# Patient Record
Sex: Female | Born: 1985 | Race: White | Hispanic: No
Health system: Western US, Academic
[De-identification: ages and names within clinical notes are randomized; demographics above are authoritative.]

## PROBLEM LIST (undated history)

## (undated) DIAGNOSIS — F329 Major depressive disorder, single episode, unspecified: Secondary | ICD-10-CM

## (undated) DIAGNOSIS — Z9884 Bariatric surgery status: Secondary | ICD-10-CM

## (undated) DIAGNOSIS — R569 Unspecified convulsions: Secondary | ICD-10-CM

## (undated) DIAGNOSIS — G40909 Epilepsy, unspecified, not intractable, without status epilepticus: Secondary | ICD-10-CM

## (undated) DIAGNOSIS — F32A Depression, unspecified: Secondary | ICD-10-CM

## (undated) DIAGNOSIS — S069XAS Unspecified intracranial injury with loss of consciousness status unknown, sequela: Secondary | ICD-10-CM

## (undated) DIAGNOSIS — F419 Anxiety disorder, unspecified: Secondary | ICD-10-CM

## (undated) HISTORY — PX: GASTRIC BYPASS: SHX52

## (undated) HISTORY — DX: Depression, unspecified: F32.A

## (undated) HISTORY — DX: Anxiety disorder, unspecified: F41.9

## (undated) HISTORY — PX: WISDOM TOOTH EXTRACTION: SHX5011

## (undated) HISTORY — DX: Unspecified intracranial injury with loss of consciousness status unknown, sequela: S06.9XAS

## (undated) HISTORY — DX: Epilepsy, unspecified, not intractable, without status epilepticus: G40.909

## (undated) DEATH — deceased

---
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
## 2006-07-18 ENCOUNTER — Other Ambulatory Visit: Admission: RE | Admit: 2006-07-18 | Discharge: 2006-07-18 | Payer: Self-pay | Admitting: Obstetrics and Gynecology

## 2010-03-19 ENCOUNTER — Emergency Department (HOSPITAL_COMMUNITY): Admission: EM | Admit: 2010-03-19 | Discharge: 2009-09-21 | Payer: Self-pay | Admitting: Infectious Diseases

## 2018-03-01 ENCOUNTER — Encounter (INDEPENDENT_AMBULATORY_CARE_PROVIDER_SITE_OTHER): Payer: Self-pay | Admitting: Family Medicine

## 2018-03-01 ENCOUNTER — Ambulatory Visit (INDEPENDENT_AMBULATORY_CARE_PROVIDER_SITE_OTHER): Payer: Self-pay | Admitting: Family Medicine

## 2018-03-01 VITALS — BP 106/77 | HR 82 | Temp 97.5°F | Resp 16 | Ht 65.0 in | Wt 181.9 lb

## 2018-03-01 DIAGNOSIS — F419 Anxiety disorder, unspecified: Secondary | ICD-10-CM

## 2018-03-01 DIAGNOSIS — G40409 Other generalized epilepsy and epileptic syndromes, not intractable, without status epilepticus: Secondary | ICD-10-CM

## 2018-03-01 DIAGNOSIS — F331 Major depressive disorder, recurrent, moderate: Secondary | ICD-10-CM

## 2018-03-01 DIAGNOSIS — F339 Major depressive disorder, recurrent, unspecified: Secondary | ICD-10-CM | POA: Insufficient documentation

## 2018-03-01 DIAGNOSIS — Z6830 Body mass index (BMI) 30.0-30.9, adult: Secondary | ICD-10-CM

## 2018-03-01 DIAGNOSIS — F1020 Alcohol dependence, uncomplicated: Secondary | ICD-10-CM

## 2018-03-01 NOTE — Progress Notes (Signed)
CC:    Chief Complaint   Patient presents with   . Follow-Up      Discuss about her medications        HPI  Marilyn Webster is a 32 year old female who presents to the office today to establish care.  She has moved temporally with her parents from [place] to have a time to recover.  She is an alcoholic and she has stopped drinking for 14 days.  Did have some rough times but she had no seizures and no complications.  She is doing well and she is committed to stay sober.  Has been taking medications regularly for depression anxiety and ADD and feels well.  She has generalized tonic clonic seizures and is under the care of a neurologist. Taking Zonisamide.  During a recent period of heavy drinking she stopped taking all her medications and has had more seizures.  She is taking Wellbutrin and MOP is concerns that this is increasing the risk of seizures.  Patient claims to be on this medication for years and has no intention to stop.  She has been taking Amphetamines since HS. Not clear to me what evaluation was done and what was the indication.  Patient is willing to wean off it.    She takes Alprazolam for anxiety. Also willing to wean off.  She has migraines as well and is not taking triptans because this did not work for her.        Over the last 2 weeks how often have you been bothered by any of the following problems?  1. Little interest or pleasure in doing things: (!) Nearly every day  2. Feeling down, depressed or hopeless: (!) Nearly every day  3. Trouble falling or staying asleep, or sleeping too much: Several days  4. Feeling tired or having little energy : (!) More than half the days  5. Poor appetite or overeating: (!) More than half the days  6. Feeling bad about yourself - or that you are a failure or have let yourself or your family down: (!) Nearly every day  7. Trouble concentrating on things, such as reading the newspaper or watching television: (!) More than half the days  8. Moving or speaking much more  slowly than usual.  Or the opposite - fidgety or restless: Several days  9. Thoughts that you would be better off dead or of hurting yourself in some way: Not at all    Total  PHQ9 Total Score: 17    10. If you checked off any problems, how difficult have these problems made it for you to do your work, take care of things at home, or get along with other people?: Very difficult      GAD7 Score, Total: 18  GAD7 Score, Difficulty: If you checked any problems, how difficult have they made it for you to do your work, take care of things at home, or get along with other people?: Very difficult    Her contact info for her current caregivers in [place]:    Dr Dwain S[facility] [phone]  Dr April Fields -[facility] [phone]    SH: patient has not been able to keep her job  She is separated form her husband and financially dependent.      Past Medical History  Past Medical History:   Diagnosis Date   . Anxiety    . Depression    . Epilepsy posttraumatic (HCC)  Past Surgical History  Past Surgical History:   Procedure Laterality Date   . LAPAROSCOP GASTRIC BYPASS     . WISDOM TOOTH EXTRACTION         Allergies  Review of patient's allergies indicates:  No Known Allergies    Medications  Current Outpatient Medications   Medication Sig Dispense Refill   . ALPRAZolam 1 MG tablet Take by mouth.     Marland Kitchen amphetamine-dextroAMPHetamine 20 MG tablet Take by mouth.     Marland Kitchen aspirin-acetaminophen-caffeine (EXCEDRIN MIGRAINE) 250-250-65 MG tablet Take by mouth.     Marland Kitchen buPROPion 100 MG tablet Take by mouth.     . Butalbital-APAP-Caffeine (FIORICET) 50-300-40 MG capsule Take by mouth.     . propranolol 20 MG tablet Take by mouth.     . QUEtiapine (SEROQUEL) 300 MG tablet Take by mouth.     . sertraline 100 MG tablet Take by mouth.     . zonisamide 100 MG capsule Take by mouth.       No current facility-administered medications for this visit.        Family History  Family History     Problem (# of Occurrences) Relation  (Name,Age of Onset)    Anxiety Disorder (1) Father    Depression (2) Mother, Father    Hypertension (1) Mother    Sleep Apnea (2) Mother, Father           Social History    Social History     Tobacco Use   . Smoking status: Never Smoker   . Smokeless tobacco: Never Used   Substance Use Topics   . Alcohol use: Not Currently   . Drug use: Not Currently         ROS   Per HPI    Physical Exam  BP 106/77   Pulse 82   Temp 97.5 F (36.4 C) (Temporal)   Resp 16   Ht 5\' 5"  (1.651 m)   Wt 181 lb 14.1 oz (82.5 kg)   LMP 02/26/2018 (Exact Date)   SpO2 100%   Breastfeeding? No   BMI 30.27 kg/m  Body mass index is 30.27 kg/m.  General appearance: healthy, alert, no distress, smiling, oriented x 3  HEENT:normocephalic, PERRLA, EOMI, normal nasal mucosa,   EXT:no clubbing, cyanosis, or edema  Neuro: grossly normal  PSYCHIATRIC   *Judgement: normal   *Orientation: oriented to person, place, time   *Memory: grossly normal   *Mood/affect: full range of affect   *Thought process: normal   *Grooming: normal   *Eye Contact: normal   *Speech: normal   *Hygiene: normal      Assessment/Plan    1. Tonic clonic seizures (HCC)  Continue medications.  At this point if wellbutrin has been used for years, I don't think it is urgent to stop it at the moment.    2. Alcoholism [facility])  Patient will connect with counseling through her mom's work and follow up with me with any needs.  Advised to take a Vit B complex supplement as well as calcium and magnesium.    3. Moderate episode of recurrent major depressive disorder (HCC)  Continue medications. Limited resources for referral.  Consider BHIP if there is enough time as patient is returning to [facility] in December    4. Anxiety  Advised to wean off benzos.  Continue the other medications.  Introduce exercise and counseling.  Follow up with me as needed.     I spent a total time of 30  minutes face-to-face with the patient, of which more than 50% was spent counseling as outlined in this note  including recommendation for medication and coordination of care.      Diagnosis and treatment options have been explained to the patient including medications side effects.  Questions have been addressed.  Patient verbalized understanding and agrees with the plan outlined today. Patient will return to medical attention if symptoms persist or worsen. Patient advised to read the information that will be provided by his pharmacist and to call if questions.  AVS was reviewed with patient.     Elsie SaasEsther Sharonne Ricketts, MD  [facility]  [facility]    Notes on Charting and calculation of UJW:[id number]:details of coordination of care may appear as preceding or following telephone encounters, or notes in order details sections of orders, or in notes attached to labs ordered incident to this visit.

## 2018-03-16 ENCOUNTER — Encounter (INDEPENDENT_AMBULATORY_CARE_PROVIDER_SITE_OTHER): Payer: Self-pay | Admitting: Family Medicine

## 2018-03-16 DIAGNOSIS — F419 Anxiety disorder, unspecified: Secondary | ICD-10-CM

## 2018-03-16 MED ORDER — BUPROPION HCL ER (SR) 100 MG OR TB12
100.0000 mg | EXTENDED_RELEASE_TABLET | Freq: Two times a day (BID) | ORAL | 0 refills | Status: AC
Start: 2018-03-16 — End: ?

## 2018-03-16 NOTE — Telephone Encounter (Signed)
Routing to provider.     Pharmacy pended.    Please review and advise. Thank you.     From OV 03/01/2018:    1. Tonic clonic seizures (HCC)  Continue medications.  At this point if wellbutrin has been used for years, I don't think it is urgent to stop it at the moment.

## 2018-12-21 ENCOUNTER — Encounter (HOSPITAL_COMMUNITY): Payer: Self-pay | Admitting: Emergency Medicine

## 2018-12-21 ENCOUNTER — Other Ambulatory Visit: Payer: Self-pay

## 2018-12-21 ENCOUNTER — Observation Stay (HOSPITAL_COMMUNITY): Payer: Medicaid Other

## 2018-12-21 ENCOUNTER — Inpatient Hospital Stay (HOSPITAL_COMMUNITY)
Admission: EM | Admit: 2018-12-21 | Discharge: 2018-12-26 | DRG: 440 | Disposition: A | Discharge status: Home / Self Care | Payer: Medicaid Other | Attending: Internal Medicine | Admitting: Internal Medicine

## 2018-12-21 ENCOUNTER — Emergency Department (HOSPITAL_COMMUNITY): Payer: Medicaid Other

## 2018-12-21 DIAGNOSIS — Z20828 Contact with and (suspected) exposure to other viral communicable diseases: Secondary | ICD-10-CM | POA: Diagnosis present

## 2018-12-21 DIAGNOSIS — I10 Essential (primary) hypertension: Secondary | ICD-10-CM | POA: Diagnosis present

## 2018-12-21 DIAGNOSIS — K8020 Calculus of gallbladder without cholecystitis without obstruction: Secondary | ICD-10-CM

## 2018-12-21 DIAGNOSIS — F329 Major depressive disorder, single episode, unspecified: Secondary | ICD-10-CM | POA: Diagnosis present

## 2018-12-21 DIAGNOSIS — F419 Anxiety disorder, unspecified: Secondary | ICD-10-CM

## 2018-12-21 DIAGNOSIS — Z9109 Other allergy status, other than to drugs and biological substances: Secondary | ICD-10-CM

## 2018-12-21 DIAGNOSIS — Z888 Allergy status to other drugs, medicaments and biological substances status: Secondary | ICD-10-CM

## 2018-12-21 DIAGNOSIS — G40909 Epilepsy, unspecified, not intractable, without status epilepticus: Secondary | ICD-10-CM | POA: Diagnosis present

## 2018-12-21 DIAGNOSIS — K859 Acute pancreatitis without necrosis or infection, unspecified: Secondary | ICD-10-CM | POA: Diagnosis not present

## 2018-12-21 DIAGNOSIS — R05 Cough: Secondary | ICD-10-CM

## 2018-12-21 DIAGNOSIS — Z9884 Bariatric surgery status: Secondary | ICD-10-CM

## 2018-12-21 DIAGNOSIS — Z79899 Other long term (current) drug therapy: Secondary | ICD-10-CM

## 2018-12-21 DIAGNOSIS — R059 Cough, unspecified: Secondary | ICD-10-CM

## 2018-12-21 HISTORY — DX: Major depressive disorder, single episode, unspecified: F32.9

## 2018-12-21 HISTORY — DX: Unspecified convulsions: R56.9

## 2018-12-21 HISTORY — DX: Bariatric surgery status: Z98.84

## 2018-12-21 LAB — COMPREHENSIVE METABOLIC PANEL
ALT: 19 U/L (ref 0–44)
AST: 34 U/L (ref 15–41)
Albumin: 3 g/dL — ABNORMAL LOW (ref 3.5–5.0)
Alkaline Phosphatase: 87 U/L (ref 38–126)
Anion gap: 9 (ref 5–15)
BUN: 13 mg/dL (ref 6–20)
CO2: 22 mmol/L (ref 22–32)
Calcium: 8.7 mg/dL — ABNORMAL LOW (ref 8.9–10.3)
Chloride: 104 mmol/L (ref 98–111)
Creatinine, Ser: 0.86 mg/dL (ref 0.44–1.00)
GFR calc Af Amer: >60 mL/min (ref >60)
GFR calc non Af Amer: >60 mL/min (ref >60)
Glucose, Bld: 126 mg/dL — ABNORMAL HIGH (ref 70–99)
Potassium: 3.6 mmol/L (ref 3.5–5.1)
Sodium: 135 mmol/L (ref 135–145)
Total Bilirubin: 0.9 mg/dL (ref 0.3–1.2)
Total Protein: 5.4 g/dL — ABNORMAL LOW (ref 6.5–8.1)

## 2018-12-21 LAB — CBC
HCT: 48.9 % — ABNORMAL HIGH (ref 36.0–46.0)
Hemoglobin: 16.5 g/dL — ABNORMAL HIGH (ref 12.0–15.0)
MCH: 31.4 pg (ref 26.0–34.0)
MCHC: 33.7 g/dL (ref 30.0–36.0)
MCV: 93.1 fL (ref 80.0–100.0)
Platelets: 239 10*3/uL (ref 150–400)
RBC: 5.25 MIL/uL — ABNORMAL HIGH (ref 3.87–5.11)
RDW: 15.6 % — ABNORMAL HIGH (ref 11.5–15.5)
WBC: 14.3 10*3/uL — ABNORMAL HIGH (ref 4.0–10.5)
nRBC: 0 % (ref 0.0–0.2)

## 2018-12-21 LAB — I-STAT BETA HCG BLOOD, ED (MC, WL, AP ONLY): I-stat hCG, quantitative: <5 m[IU]/mL (ref <5)

## 2018-12-21 LAB — LIPASE, BLOOD: Lipase: 514 U/L — ABNORMAL HIGH (ref 11–51)

## 2018-12-21 LAB — SARS CORONAVIRUS 2 (TAT 6-24 HRS): SARS Coronavirus 2: NEGATIVE

## 2018-12-21 MED ORDER — SERTRALINE HCL 100 MG PO TABS
100.0000 mg | ORAL_TABLET | Freq: Every day | ORAL | Status: DC
  Administered 2018-12-22 – 2018-12-26 (×5): 100 mg via ORAL
  Filled 2018-12-21 (×6): qty 1

## 2018-12-21 MED ORDER — OXYCODONE HCL 5 MG PO TABS
5.0000 mg | ORAL_TABLET | ORAL | Status: DC | PRN
  Administered 2018-12-22 – 2018-12-23 (×6): 5 mg via ORAL
  Filled 2018-12-21 (×6): qty 1

## 2018-12-21 MED ORDER — HYDROMORPHONE HCL 1 MG/ML IJ SOLN
1.0000 mg | Freq: Once | INTRAMUSCULAR | Status: AC
  Administered 2018-12-21: 1 mg via INTRAVENOUS
  Filled 2018-12-21: qty 1

## 2018-12-21 MED ORDER — PROMETHAZINE HCL 25 MG/ML IJ SOLN
12.5000 mg | Freq: Four times a day (QID) | INTRAMUSCULAR | Status: AC | PRN
  Administered 2018-12-21 – 2018-12-22 (×2): 12.5 mg via INTRAVENOUS
  Filled 2018-12-21 (×2): qty 1

## 2018-12-21 MED ORDER — IOHEXOL 300 MG/ML  SOLN
100.0000 mL | Freq: Once | INTRAMUSCULAR | Status: AC | PRN
  Administered 2018-12-21: 100 mL via INTRAVENOUS

## 2018-12-21 MED ORDER — ONDANSETRON HCL 4 MG/2ML IJ SOLN
4.0000 mg | Freq: Four times a day (QID) | INTRAMUSCULAR | Status: DC | PRN
  Filled 2018-12-21: qty 2

## 2018-12-21 MED ORDER — HYDROMORPHONE HCL 1 MG/ML IJ SOLN
INTRAMUSCULAR | Status: AC
  Filled 2018-12-21: qty 1

## 2018-12-21 MED ORDER — QUETIAPINE FUMARATE 50 MG PO TABS
300.0000 mg | ORAL_TABLET | Freq: Every day | ORAL | Status: DC
  Administered 2018-12-21 – 2018-12-25 (×5): 300 mg via ORAL
  Filled 2018-12-21 (×5): qty 6

## 2018-12-21 MED ORDER — ENOXAPARIN SODIUM 40 MG/0.4ML ~~LOC~~ SOLN
40.0000 mg | SUBCUTANEOUS | Status: DC
  Administered 2018-12-21 – 2018-12-25 (×5): 40 mg via SUBCUTANEOUS
  Filled 2018-12-21 (×5): qty 0.4

## 2018-12-21 MED ORDER — HYDROMORPHONE HCL 1 MG/ML IJ SOLN
1.0000 mg | Freq: Once | INTRAMUSCULAR | Status: AC
  Administered 2018-12-21: 14:00:00 1 mg via INTRAVENOUS

## 2018-12-21 MED ORDER — ACETAMINOPHEN 650 MG RE SUPP: 650.0000 mg | Freq: Four times a day (QID) | RECTAL | Status: DC | PRN

## 2018-12-21 MED ORDER — HYDROMORPHONE HCL 1 MG/ML IJ SOLN
1.0000 mg | INTRAMUSCULAR | Status: DC | PRN
  Administered 2018-12-21 – 2018-12-24 (×9): 1 mg via INTRAVENOUS
  Filled 2018-12-21 (×9): qty 1

## 2018-12-21 MED ORDER — MORPHINE SULFATE (PF) 2 MG/ML IV SOLN
2.0000 mg | INTRAVENOUS | Status: DC | PRN
  Filled 2018-12-21: qty 1

## 2018-12-21 MED ORDER — SODIUM CHLORIDE 0.9% FLUSH
3.0000 mL | Freq: Once | INTRAVENOUS | Status: AC
  Administered 2018-12-21: 3 mL via INTRAVENOUS

## 2018-12-21 MED ORDER — ACETAMINOPHEN 325 MG PO TABS: 650.0000 mg | ORAL_TABLET | Freq: Four times a day (QID) | ORAL | Status: DC | PRN

## 2018-12-21 MED ORDER — ADULT MULTIVITAMIN W/MINERALS CH
1.0000 | ORAL_TABLET | Freq: Every day | ORAL | Status: DC
  Administered 2018-12-22: 1 via ORAL
  Filled 2018-12-21: qty 1

## 2018-12-21 MED ORDER — DEXTROSE-NACL 5-0.45 % IV SOLN
INTRAVENOUS | Status: DC
  Administered 2018-12-21: 20:00:00 via INTRAVENOUS

## 2018-12-21 MED ORDER — ONDANSETRON HCL 4 MG PO TABS: 4.0000 mg | ORAL_TABLET | Freq: Four times a day (QID) | ORAL | Status: DC | PRN

## 2018-12-21 MED ORDER — LACTATED RINGERS IV BOLUS
1000.0000 mL | Freq: Once | INTRAVENOUS | Status: AC
  Administered 2018-12-21: 16:00:00 1000 mL via INTRAVENOUS

## 2018-12-21 MED ORDER — LAMOTRIGINE 100 MG PO TABS
100.0000 mg | ORAL_TABLET | Freq: Every day | ORAL | Status: DC
  Administered 2018-12-22 – 2018-12-26 (×5): 100 mg via ORAL
  Filled 2018-12-21 (×5): qty 1

## 2018-12-21 MED ORDER — ZONISAMIDE 100 MG PO CAPS
200.0000 mg | ORAL_CAPSULE | Freq: Every day | ORAL | Status: DC
  Administered 2018-12-21 – 2018-12-25 (×5): 200 mg via ORAL
  Filled 2018-12-21 (×5): qty 2

## 2018-12-21 MED ORDER — SODIUM CHLORIDE 0.9 % IV BOLUS
1000.0000 mL | Freq: Once | INTRAVENOUS | Status: AC
  Administered 2018-12-21: 13:00:00 1000 mL via INTRAVENOUS

## 2018-12-21 MED ORDER — PROPRANOLOL HCL 20 MG PO TABS
20.0000 mg | ORAL_TABLET | Freq: Two times a day (BID) | ORAL | Status: DC
  Administered 2018-12-21 – 2018-12-26 (×10): 20 mg via ORAL
  Filled 2018-12-21 (×10): qty 1

## 2018-12-21 MED ORDER — LACTATED RINGERS IV BOLUS
1000.0000 mL | Freq: Once | INTRAVENOUS | Status: AC
  Administered 2018-12-21: 1000 mL via INTRAVENOUS

## 2018-12-21 MED ORDER — KETAMINE HCL 50 MG/5ML IJ SOSY: 0.3000 mg/kg | PREFILLED_SYRINGE | Freq: Once | INTRAMUSCULAR | Status: DC

## 2018-12-21 MED ORDER — ONDANSETRON HCL 4 MG/2ML IJ SOLN
4.0000 mg | Freq: Once | INTRAMUSCULAR | Status: AC
  Administered 2018-12-21: 4 mg via INTRAVENOUS
  Filled 2018-12-21: qty 2

## 2018-12-21 NOTE — ED Triage Notes (Signed)
Patient reports generalized abdominal pain ("from my hips to my upper abdomen on both sides") with N/V since yesterday. Denies fevers/chills, diarrhea, or urinary symptoms.

## 2018-12-21 NOTE — ED Notes (Signed)
Patient transported to CT 

## 2018-12-21 NOTE — ED Provider Notes (Signed)
[facility] Provider Note   CSN: [id number] Arrival date & time: 12/21/18  1154     History   Chief Complaint Chief Complaint  Patient presents with   Abdominal Pain    HPI [facility] is a 33 y.o. female.     HPI  33 year old female presents with diffuse abdominal pain and vomiting.  Started yesterday but was not that bad and then has worsened.  The pain is diffuse and sharp.  It is rated as severe.  She is also had numerous episodes of emesis and cannot keep any fluids down no diarrhea/constipation.  No urinary or vaginal symptoms.  No fevers. Never had similar pain before. Has not been an able to keep anything down.  Past Medical History:  Diagnosis Date   H/O gastric bypass    Major depressive disorder    Seizures (HCC)    epilepsy    There are no active problems to display for this patient.     OB History   No obstetric history on file.      Home Medications    Prior to Admission medications   Medication Sig Start Date End Date Taking? Authorizing Provider  amphetamine-dextroamphetamine (ADDERALL) 20 MG tablet Take 20-30 mg by mouth See admin instructions. Take 30 mg in the morning, 30 mg at lunch and 20 mg in the evening as needed   Yes [provider]  lamoTRIgine (LAMICTAL) 100 MG tablet Take 100 mg by mouth daily.   Yes [provider]  Multiple Vitamin (MULTIVITAMIN WITH MINERALS) TABS tablet Take 1 tablet by mouth daily.   Yes [provider]  propranolol (INDERAL) 20 MG tablet Take 20 mg by mouth 2 (two) times daily.   Yes [provider]  QUEtiapine (SEROQUEL) 300 MG tablet Take 300 mg by mouth at bedtime.   Yes [provider]  sertraline (ZOLOFT) 100 MG tablet Take 300 mg by mouth daily.   Yes [provider]  zonisamide (ZONEGRAN) 100 MG capsule Take 200 mg by mouth at bedtime.   Yes [provider]    Family History No family history on  file.  Social History Social History   Tobacco Use   Smoking status: Never Smoker   Smokeless tobacco: Never Used  Substance Use Topics   Alcohol use: Not Currently   Drug use: Not on file     Allergies   Other   Review of Systems Review of Systems  Constitutional: Negative for fever.  Gastrointestinal: Positive for abdominal pain, nausea and vomiting.  Genitourinary: Negative for dysuria, hematuria, menstrual problem, vaginal bleeding and vaginal discharge.  Musculoskeletal: Positive for back pain.  All other systems reviewed and are negative.    Physical Exam Updated Vital Signs BP 97/67    Pulse 73    Temp 97.7 F (36.5 C) (Oral)    Resp 16    Ht 5\' 5"  (1.651 m)    Wt 79.4 kg    SpO2 99%    BMI 29.12 kg/m   Physical Exam Vitals signs and nursing note reviewed.  Constitutional:      Appearance: She is well-developed.  HENT:     Head: Normocephalic and atraumatic.     Right Ear: External ear normal.     Left Ear: External ear normal.     Nose: Nose normal.  Eyes:     General:        Right eye: No discharge.  Left eye: No discharge.  Cardiovascular:     Rate and Rhythm: Normal rate and regular rhythm.     Heart sounds: Normal heart sounds.  Pulmonary:     Effort: Pulmonary effort is normal.     Breath sounds: Normal breath sounds.  Abdominal:     Palpations: Abdomen is soft.     Tenderness: There is generalized abdominal tenderness (worst in LUQ).  Skin:    General: Skin is warm and dry.  Neurological:     Mental Status: She is alert.  Psychiatric:        Mood and Affect: Mood is not anxious.      ED Treatments / Results  Labs (all labs ordered are listed, but only abnormal results are displayed) Labs Reviewed  LIPASE, BLOOD - Abnormal; Notable for the following components:      Result Value   Lipase 514 (*)    All other components within normal limits  COMPREHENSIVE METABOLIC PANEL - Abnormal; Notable for the following components:    Glucose, Bld 126 (*)    Calcium 8.7 (*)    Total Protein 5.4 (*)    Albumin 3.0 (*)    All other components within normal limits  CBC - Abnormal; Notable for the following components:   WBC 14.3 (*)    RBC 5.25 (*)    Hemoglobin 16.5 (*)    HCT 48.9 (*)    RDW 15.6 (*)    All other components within normal limits  SARS CORONAVIRUS 2 (TAT 6-24 HRS)  URINALYSIS, ROUTINE W REFLEX MICROSCOPIC  I-STAT BETA HCG BLOOD, ED ([facility], [facility], [facility] ONLY)    EKG None  Radiology Ct Abdomen Pelvis W Contrast  Result Date: 12/21/2018 CLINICAL DATA:  Abdominal pain with nausea and vomiting EXAM: CT ABDOMEN AND PELVIS WITH CONTRAST TECHNIQUE: Multidetector CT imaging of the abdomen and pelvis was performed using the standard protocol following bolus administration of intravenous contrast. CONTRAST:  153mL OMNIPAQUE IOHEXOL 300 MG/ML  SOLN COMPARISON:  None. FINDINGS: Lower chest: Lung bases are clear. Hepatobiliary: No focal liver lesions are appreciable. Gallbladder is borderline distended without gallbladder wall thickening. There is no demonstrable biliary duct dilatation. Pancreas: There is extensive peripancreatic fluid consistent with pancreatitis. Pancreas appears subtly edematous but not enlarged. There is no well-defined pancreatic mass or pseudocyst. There is no pancreatic duct dilatation. No pancreatic calcification. Pancreas enhances without focal necrosis evident. Fluid surrounds the entire pancreas. On the right, fluid tracks to the right and abuts the gallbladder and upper pole right kidney. Fluid surrounds the first and second portions of the duodenum and to a lesser extent the proximal third portion of the duodenum. To the left of midline, fluid tracks superiorly between the stomach and spleen. Fluid tracks between the spleen and left kidney with fluid surrounding the medial, anterior, and lateral aspects of the left kidney. Fluid tracks inferiorly to the level of the lateral conal fascia on the left.  Fluid tracks more medially on the left to the level of the aorta. Fluid surrounds the more distal duodenum and proximal most aspect of the jejunum. Spleen: No splenic lesions are evident. Adrenals/Urinary Tract: Adrenals appear unremarkable bilaterally. Kidneys bilaterally do not show edema. There is no renal mass or hydronephrosis on either side. There is no evident renal or ureteral calculus on either side. Urinary bladder is midline with wall thickness within normal limits. Stomach/Bowel: The patient is status post gastric bypass grafting. There is no thickening in the walls of the postoperative regions.  There is slight thickening in the walls of the distal second and proximal third duodenum with surrounding pancreatic fluid. No other bowel wall thickening is evident. No bowel obstruction present. Terminal ileum appears normal. No evident free air or portal venous air. Vascular/Lymphatic: No abdominal aortic aneurysm. No evident vascular lesions. No adenopathy is appreciable in the abdomen or pelvis. Reproductive: Uterus is anteverted.  No evident pelvic mass. Other: Appendix is diminutive. There is no periappendiceal region inflammatory change. There is no evident abscess in the abdomen or pelvis. Musculoskeletal: No blastic or lytic bone lesions. No intramuscular or abdominal wall lesions are evident. IMPRESSION: 1. Acute pancreatitis with extensive peripancreatic fluid. Pancreas is mildly edematous without pancreatic mass or pseudocyst. No pancreatic duct dilatation. No pancreatic necrosis is appreciable. 2. Status post gastric bypass procedure without complicating features. 3. Wall thickening involving portions of the second and proximal third duodenum, likely due to surrounding peripancreatic fluid. No other bowel wall thickening. No bowel obstruction. No abscess in the abdomen or pelvis. No periappendiceal region inflammation. 4. Gallbladder is borderline distended without focal gallbladder lesion. 5. No  renal or ureteral calculus. No hydronephrosis. Urinary bladder wall thickness normal. Electronically Signed   By: Bretta BangWilliam  Woodruff III M.D.   On: 12/21/2018 14:47    Procedures Procedures (including critical care time)  Medications Ordered in ED Medications  lactated ringers bolus 1,000 mL (has no administration in time range)  sodium chloride flush (NS) 0.9 % injection 3 mL (3 mLs Intravenous Given 12/21/18 1452)  HYDROmorphone (DILAUDID) injection 1 mg (1 mg Intravenous Given 12/21/18 1240)  ondansetron (ZOFRAN) injection 4 mg (4 mg Intravenous Given 12/21/18 1240)  sodium chloride 0.9 % bolus 1,000 mL (0 mLs Intravenous Stopped 12/21/18 1438)  HYDROmorphone (DILAUDID) injection 1 mg (1 mg Intravenous Given 12/21/18 1407)  lactated ringers bolus 1,000 mL (1,000 mLs Intravenous New Bag/Given 12/21/18 1451)  iohexol (OMNIPAQUE) 300 MG/ML solution 100 mL (100 mLs Intravenous Contrast Given 12/21/18 1417)     Initial Impression / Assessment and Plan / ED Course  I have reviewed the triage vital signs and the nursing notes.  Pertinent labs & imaging results that were available during my care of the patient were reviewed by me and considered in my medical decision making (see chart for details).        CT scan shows acute pancreatitis.  Lipase is elevated.  Pretty extensive inflammation on CT and given her poor pain control, I think she will need admission for pain control and IV fluids. Dr Sharyon MedicusHijazi to admit.  Final Clinical Impressions(s) / ED Diagnoses   Final diagnoses:  Acute pancreatitis without infection or necrosis, unspecified pancreatitis type    ED Discharge Orders    None       Pricilla LovelessGoldston, Justine Cossin, MD 12/21/18 1524

## 2018-12-21 NOTE — H&P (Signed)
[facility]                                                                                    Patient Demographics  Kathryn Rubio, is a 33 y.o. female  CSN: [id number]  MRN: [id number]  DOB - 26-May-1985  Admit Date - 12/21/2018  Outpatient Primary MD for the patient is Patient, No Pcp Per   With History of -  Past Medical History:  Diagnosis Date  . H/O gastric bypass   . Major depressive disorder   . Seizures ([place])    epilepsy        in for   Chief Complaint  Patient presents with  . Abdominal Pain     HPI  Kathryn Rubio  is a 33 y.o. 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  Denies any history of alcoholism denies any similar previous episodes. In the emergency room her lipase was elevated and CT of the abdomen showed evidence of pancreatitis. Ultrasound of abdomen was ordered and pending.    Review of Systems    In addition to the HPI above,  No Fever-chills, No Headache, No changes with Vision or hearing, No problems swallowing food or Liquids, No Chest pain, Cough or Shortness of Breath,  No Blood in stool or Urine, No dysuria, No new skin rashes or bruises, No new joints pains-aches,  No new weakness, tingling, numbness in any extremity, No recent weight gain or loss, No polyuria, polydypsia or polyphagia, No significant Mental Stressors.  Other systems were reviewed and were negative.   Social History Social History   Tobacco Use  . Smoking status: Never Smoker  . Smokeless tobacco: Never Used  Substance Use Topics  . Alcohol use: Not Currently     Family History No family history on file.   Prior to Admission medications   Medication Sig Start Date End Date Taking? Authorizing Provider  amphetamine-dextroamphetamine (ADDERALL) 20 MG tablet Take 20-30  mg by mouth See admin instructions. Take 30 mg in the morning, 30 mg at lunch and 20 mg in the evening as needed   Yes [provider]  lamoTRIgine (LAMICTAL) 100 MG tablet Take 100 mg by mouth daily.   Yes [provider]  Multiple Vitamin (MULTIVITAMIN WITH MINERALS) TABS tablet Take 1 tablet by mouth daily.   Yes [provider]  propranolol (INDERAL) 20 MG tablet Take 20 mg by mouth 2 (two) times daily.   Yes [provider]  QUEtiapine (SEROQUEL) 300 MG tablet Take 300 mg by mouth at bedtime.   Yes [provider]  sertraline (ZOLOFT) 100 MG tablet Take 300 mg by mouth daily.   Yes [provider]  zonisamide (ZONEGRAN) 100 MG capsule Take 200 mg by mouth at bedtime.   Yes [provider]    Allergies  Allergen Reactions  . Other Swelling    Cilantro  Lips and mouth    Physical Exam  Vitals  Blood pressure 97/67, pulse 73, temperature 97.7 F (36.5 C), temperature source Oral, resp.  rate 16, height 5\' 5"  (1.651 m), weight 79.4 kg, SpO2 99 %.   General appearance, looks acutely ill, tired HEENT no jaundice or pallor, no facial deviation oral thrush Neck supple, no neck vein distention Chest clear and resonant Heart normal S1-S2, no murmurs gallops or rubs Abdomen soft generalized tenderness noted Extremities no clubbing cyanosis or edema  Data Review  CBC Recent Labs  Lab 12/21/18 1203  WBC 14.3*  HGB 16.5*  HCT 48.9*  PLT 239  MCV 93.1  MCH 31.4  MCHC 33.7  RDW 15.6*   ------------------------------------------------------------------------------------------------------------------  Chemistries  Recent Labs  Lab 12/21/18 1203  NA 135  K 3.6  CL 104  CO2 22  GLUCOSE 126*  BUN 13  CREATININE 0.86  CALCIUM 8.7*  AST 34  ALT 19  ALKPHOS 87  BILITOT 0.9   ------------------------------------------------------------------------------------------------------------------ estimated creatinine  clearance is 96.9 mL/min (by C-G formula based on SCr of 0.86 mg/dL). ------------------------------------------------------------------------------------------------------------------ No results for input(s): TSH, T4TOTAL, T3FREE, THYROIDAB in the last 72 hours.  Invalid input(s): FREET3   Coagulation profile No results for input(s): INR, PROTIME in the last 168 hours. ------------------------------------------------------------------------------------------------------------------- No results for input(s): DDIMER in the last 72 hours. -------------------------------------------------------------------------------------------------------------------  Cardiac Enzymes No results for input(s): CKMB, TROPONINI, MYOGLOBIN in the last 168 hours.  Invalid input(s): CK ------------------------------------------------------------------------------------------------------------------ Invalid input(s): POCBNP   ---------------------------------------------------------------------------------------------------------------  Urinalysis No results found for: COLORURINE, APPEARANCEUR, LABSPEC, PHURINE, GLUCOSEU, HGBUR, BILIRUBINUR, KETONESUR, PROTEINUR, UROBILINOGEN, NITRITE, LEUKOCYTESUR  ----------------------------------------------------------------------------------------------------------------   Imaging results:   Ct Abdomen Pelvis W Contrast  Result Date: 12/21/2018 CLINICAL DATA:  Abdominal pain with nausea and vomiting EXAM: CT ABDOMEN AND PELVIS WITH CONTRAST TECHNIQUE: Multidetector CT imaging of the abdomen and pelvis was performed using the standard protocol following bolus administration of intravenous contrast. CONTRAST:  OMNIPAQUE IOHEXOL 300 MG/ML  SOLN COMPARISON:  None. FINDINGS: Lower chest: Lung bases are clear. Hepatobiliary: No focal liver lesions are appreciable. Gallbladder is borderline distended without gallbladder wall thickening. There is no demonstrable biliary  duct dilatation. Pancreas: There is extensive peripancreatic fluid consistent with pancreatitis. Pancreas appears subtly edematous but not enlarged. There is no well-defined pancreatic mass or pseudocyst. There is no pancreatic duct dilatation. No pancreatic calcification. Pancreas enhances without focal necrosis evident. Fluid surrounds the entire pancreas. On the right, fluid tracks to the right and abuts the gallbladder and upper pole right kidney. Fluid surrounds the first and second portions of the duodenum and to a lesser extent the proximal third portion of the duodenum. To the left of midline, fluid tracks superiorly between the stomach and spleen. Fluid tracks between the spleen and left kidney with fluid surrounding the medial, anterior, and lateral aspects of the left kidney. Fluid tracks inferiorly to the level of the lateral conal fascia on the left. Fluid tracks more medially on the left to the level of the aorta. Fluid surrounds the more distal duodenum and proximal most aspect of the jejunum. Spleen: No splenic lesions are evident. Adrenals/Urinary Tract: Adrenals appear unremarkable bilaterally. Kidneys bilaterally do not show edema. There is no renal mass or hydronephrosis on either side. There is no evident renal or ureteral calculus on either side. Urinary bladder is midline with wall thickness within normal limits. Stomach/Bowel: The patient is status post gastric bypass grafting. There is no thickening in the walls of the postoperative regions. There is slight thickening in the walls of the distal second and proximal third duodenum with surrounding pancreatic fluid. No other bowel wall thickening is evident. No  bowel obstruction present. Terminal ileum appears normal. No evident free air or portal venous air. Vascular/Lymphatic: No abdominal aortic aneurysm. No evident vascular lesions. No adenopathy is appreciable in the abdomen or pelvis. Reproductive: Uterus is anteverted.  No evident pelvic  mass. Other: Appendix is diminutive. There is no periappendiceal region inflammatory change. There is no evident abscess in the abdomen or pelvis. Musculoskeletal: No blastic or lytic bone lesions. No intramuscular or abdominal wall lesions are evident. IMPRESSION: 1. Acute pancreatitis with extensive peripancreatic fluid. Pancreas is mildly edematous without pancreatic mass or pseudocyst. No pancreatic duct dilatation. No pancreatic necrosis is appreciable. 2. Status post gastric bypass procedure without complicating features. 3. Wall thickening involving portions of the second and proximal third duodenum, likely due to surrounding peripancreatic fluid. No other bowel wall thickening. No bowel obstruction. No abscess in the abdomen or pelvis. No periappendiceal region inflammation. 4. Gallbladder is borderline distended without focal gallbladder lesion. 5. No renal or ureteral calculus. No hydronephrosis. Urinary bladder wall thickness normal. Electronically Signed   By: Bretta BangWilliam  Woodruff III M.D.   On: 12/21/2018 14:47      Assessment & Plan  Acute pancreatitis, first episode Clear liquid diet IV fluids Pain control Check ultrasound for cholelithiasis and consult surgery if needed  Anxiety/depression Continue Zoloft and Lamictal  Hypertension Continue with inderal    DVT Prophylaxis Lovenox  AM Labs Ordered, also please review Full Orders    Code Status full  Disposition Plan: Home  Time spent in minutes : 43 minutes  Condition GUARDED   @SIGNATURE @

## 2018-12-21 NOTE — Progress Notes (Signed)
New Admission Note: Patient admitted to room 5M17 from [facility] ER  Arrival Method:  Via stretcher Mental Orientation: Alert and oriented x 4 Telemetry: N/A Assessment: Completed Skin: Intact IV: L AC Pain: c/o pain at 8/10 Tubes: None Safety Measures: Safety Fall Prevention Plan has been discussed  Admission: To be completed 5 [place] Orientation: Patient has been orientated to the room, unit and staff.   Family: None at bedside  Orders to be reviewed and implemented. Will continue to monitor the patient. Call light has been placed within reach and bed alarm has been activated.

## 2018-12-21 NOTE — ED Notes (Signed)
Report given to Kami RN.

## 2018-12-22 DIAGNOSIS — F329 Major depressive disorder, single episode, unspecified: Secondary | ICD-10-CM | POA: Diagnosis present

## 2018-12-22 DIAGNOSIS — Z888 Allergy status to other drugs, medicaments and biological substances status: Secondary | ICD-10-CM | POA: Diagnosis not present

## 2018-12-22 DIAGNOSIS — K852 Alcohol induced acute pancreatitis without necrosis or infection: Secondary | ICD-10-CM

## 2018-12-22 DIAGNOSIS — Z79899 Other long term (current) drug therapy: Secondary | ICD-10-CM | POA: Diagnosis not present

## 2018-12-22 DIAGNOSIS — I10 Essential (primary) hypertension: Secondary | ICD-10-CM | POA: Diagnosis present

## 2018-12-22 DIAGNOSIS — K859 Acute pancreatitis without necrosis or infection, unspecified: Secondary | ICD-10-CM | POA: Diagnosis present

## 2018-12-22 DIAGNOSIS — Z9109 Other allergy status, other than to drugs and biological substances: Secondary | ICD-10-CM | POA: Diagnosis not present

## 2018-12-22 DIAGNOSIS — Z20828 Contact with and (suspected) exposure to other viral communicable diseases: Secondary | ICD-10-CM | POA: Diagnosis present

## 2018-12-22 DIAGNOSIS — G40909 Epilepsy, unspecified, not intractable, without status epilepticus: Secondary | ICD-10-CM | POA: Diagnosis present

## 2018-12-22 DIAGNOSIS — F419 Anxiety disorder, unspecified: Secondary | ICD-10-CM | POA: Diagnosis present

## 2018-12-22 DIAGNOSIS — Z9884 Bariatric surgery status: Secondary | ICD-10-CM | POA: Diagnosis not present

## 2018-12-22 LAB — RAPID URINE DRUG SCREEN, HOSP PERFORMED
Amphetamines: POSITIVE — AB
Barbiturates: NOT DETECTED
Benzodiazepines: NOT DETECTED
Cocaine: NOT DETECTED
Opiates: POSITIVE — AB
Tetrahydrocannabinol: NOT DETECTED

## 2018-12-22 LAB — URINALYSIS, ROUTINE W REFLEX MICROSCOPIC
Bilirubin Urine: NEGATIVE
Glucose, UA: NEGATIVE mg/dL
Ketones, ur: NEGATIVE mg/dL
Nitrite: POSITIVE — AB
Protein, ur: 100 mg/dL — AB
Specific Gravity, Urine: 1.03 (ref 1.005–1.030)
pH: 5 (ref 5.0–8.0)

## 2018-12-22 LAB — HIV ANTIBODY (ROUTINE TESTING W REFLEX): HIV Screen 4th Generation wRfx: NONREACTIVE

## 2018-12-22 MED ORDER — LORAZEPAM 2 MG/ML IJ SOLN: 0.0000 mg | Freq: Two times a day (BID) | INTRAMUSCULAR | Status: DC

## 2018-12-22 MED ORDER — FOLIC ACID 1 MG PO TABS
1.0000 mg | ORAL_TABLET | Freq: Every day | ORAL | Status: DC
  Administered 2018-12-23 – 2018-12-26 (×4): 1 mg via ORAL
  Filled 2018-12-22 (×4): qty 1

## 2018-12-22 MED ORDER — THIAMINE HCL 100 MG/ML IJ SOLN: 100.0000 mg | Freq: Every day | INTRAMUSCULAR | Status: DC

## 2018-12-22 MED ORDER — VITAMIN B-1 100 MG PO TABS
100.0000 mg | ORAL_TABLET | Freq: Every day | ORAL | Status: DC
  Administered 2018-12-23 – 2018-12-26 (×4): 100 mg via ORAL
  Filled 2018-12-22 (×4): qty 1

## 2018-12-22 MED ORDER — HYDROXYZINE HCL 25 MG PO TABS
25.0000 mg | ORAL_TABLET | Freq: Four times a day (QID) | ORAL | Status: AC | PRN
  Administered 2018-12-22: 25 mg via ORAL
  Filled 2018-12-22 (×2): qty 1

## 2018-12-22 MED ORDER — SODIUM CHLORIDE 0.9 % IV SOLN
INTRAVENOUS | Status: DC
  Administered 2018-12-22 – 2018-12-24 (×3): via INTRAVENOUS

## 2018-12-22 MED ORDER — LORAZEPAM 2 MG/ML IJ SOLN: 1.0000 mg | INTRAMUSCULAR | Status: DC | PRN

## 2018-12-22 MED ORDER — CHLORDIAZEPOXIDE HCL 25 MG PO CAPS
25.0000 mg | ORAL_CAPSULE | Freq: Four times a day (QID) | ORAL | Status: AC | PRN
  Administered 2018-12-22 – 2018-12-23 (×2): 25 mg via ORAL
  Filled 2018-12-22 (×2): qty 1

## 2018-12-22 MED ORDER — PROMETHAZINE HCL 25 MG/ML IJ SOLN
12.5000 mg | Freq: Four times a day (QID) | INTRAMUSCULAR | Status: AC | PRN
  Administered 2018-12-22 – 2018-12-24 (×7): 12.5 mg via INTRAVENOUS
  Filled 2018-12-22 (×7): qty 1

## 2018-12-22 MED ORDER — LORAZEPAM 1 MG PO TABS: 1.0000 mg | ORAL_TABLET | ORAL | Status: DC | PRN

## 2018-12-22 MED ORDER — LORAZEPAM 2 MG/ML IJ SOLN: 0.0000 mg | Freq: Four times a day (QID) | INTRAMUSCULAR | Status: DC

## 2018-12-22 MED ORDER — ADULT MULTIVITAMIN W/MINERALS CH
1.0000 | ORAL_TABLET | Freq: Every day | ORAL | Status: DC
  Administered 2018-12-23 – 2018-12-26 (×4): 1 via ORAL
  Filled 2018-12-22 (×4): qty 1

## 2018-12-22 NOTE — Progress Notes (Addendum)
Informed by nursing that patient was becoming agitated and hallucinating.  Suspect she is withdrawing from alcohol and she drinks more than she disclosed.  Will add CIWA protocol and monitor closely for any mental status changes. Eulogio Bear DO  Late entry: Patient's mental status was able to improve enough to refuse ativan but request xanax.

## 2018-12-22 NOTE — Progress Notes (Signed)
Pt refused ativan per CIWA protocol. Says it causes her to be anxious, paranoid, and causes nightmares. Did notify MD. Awaiting new orders/instructions.

## 2018-12-22 NOTE — Progress Notes (Signed)
Progress Note    [name]  NMM:[id number] DOB: 09/18/1985  DOA: 12/21/2018 PCP: Patient, No Pcp Per    Brief Narrative:     Medical records reviewed and are as summarized below:  Kathryn Rubio is an 33 y.o. 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  Denies any history of alcoholism denies any similar previous episodes. In the emergency room her lipase was elevated and CT of the abdomen showed evidence of pancreatitis.   Assessment/Plan:   Active Problems:   Pancreatitis   Acute pancreatitis    Severe Acute pancreatitis, first episode -NPO IV fluids- change to NS and increase Pain control -IV phenergan -drank some alcohol over the weekend so I suspect this is the cause -Negative for gallstones -FLP in AM  Anxiety/depression Continue Zoloft and Lamictal  Hypertension Continue with inderal   Family Communication/Anticipated D/C date and plan/Code Status   DVT prophylaxis: Lovenox ordered. Code Status: Full Code.  Family Communication:  Disposition Plan: continues to need IV pain meds, IVF and IV phenergan-- will change to inpatient    Medical Consultants:    None.    Subjective:   Still having a lot of pain  Objective:    Vitals:   12/21/18 1807 12/21/18 2118 12/22/18 0524 12/22/18 0928  BP: (!) 124/96 105/84 (!) 104/93 106/90  Pulse: 69 75 93 96  Resp: 20 18 18 18   Temp:  98.4 F (36.9 C) (!) 97.5 F (36.4 C)   TempSrc:   Oral   SpO2: 100% 99% 98%   Weight:  79.2 kg    Height: 5\' 5"  (1.651 m)       Intake/Output Summary (Last 24 hours) at 12/22/2018 1124 Last data filed at 12/22/2018 0600 Gross per 24 hour  Intake 943.74 ml  Output 0 ml  Net 943.74 ml   Filed Weights   12/21/18 1353 12/21/18 2118  Weight: 79.4 kg 79.2 kg    Exam: In bed, appears  uncomfortable rrr Clear, no increase of breathing A+Ox3  Data Reviewed:   I have personally reviewed following labs and imaging studies:  Labs: Labs show the following:   Basic Metabolic Panel: Recent Labs  Lab 12/21/18 1203  NA 135  K 3.6  CL 104  CO2 22  GLUCOSE 126*  BUN 13  CREATININE 0.86  CALCIUM 8.7*   GFR Estimated Creatinine Clearance: 96.8 mL/min (by C-G formula based on SCr of 0.86 mg/dL). Liver Function Tests: Recent Labs  Lab 12/21/18 1203  AST 34  ALT 19  ALKPHOS 87  BILITOT 0.9  PROT 5.4*  ALBUMIN 3.0*   Recent Labs  Lab 12/21/18 1203  LIPASE 514*   No results for input(s): AMMONIA in the last 168 hours. Coagulation profile No results for input(s): INR, PROTIME in the last 168 hours.  CBC: Recent Labs  Lab 12/21/18 1203  WBC 14.3*  HGB 16.5*  HCT 48.9*  MCV 93.1  PLT 239   Cardiac Enzymes: No results for input(s): CKTOTAL, CKMB, CKMBINDEX, TROPONINI in the last 168 hours. BNP (last 3 results) No results for input(s): PROBNP in the last 8760 hours. CBG: No results for input(s): GLUCAP in the last 168 hours. D-Dimer: No results for input(s): DDIMER in the last 72 hours. Hgb A1c: No results for input(s): HGBA1C in the last 72 hours. Lipid Profile: No results for input(s): CHOL,  HDL, LDLCALC, TRIG, CHOLHDL, LDLDIRECT in the last 72 hours. Thyroid function studies: No results for input(s): TSH, T4TOTAL, T3FREE, THYROIDAB in the last 72 hours.  Invalid input(s): FREET3 Anemia work up: No results for input(s): VITAMINB12, FOLATE, FERRITIN, TIBC, IRON, RETICCTPCT in the last 72 hours. Sepsis Labs: Recent Labs  Lab 12/21/18 1203  WBC 14.3*    Microbiology Recent Results (from the past 240 hour(s))  SARS CORONAVIRUS 2 (TAT 6-24 HRS) Nasopharyngeal Nasopharyngeal Swab     Status: None   Collection Time: 12/21/18  4:21 PM   Specimen: Nasopharyngeal Swab  Result Value Ref Range Status   SARS Coronavirus 2 NEGATIVE NEGATIVE  Final    Comment: (NOTE) SARS-CoV-2 target nucleic acids are NOT DETECTED. The SARS-CoV-2 RNA is generally detectable in upper and lower respiratory specimens during the acute phase of infection. Negative results do not preclude SARS-CoV-2 infection, do not rule out co-infections with other pathogens, and should not be used as the sole basis for treatment or other patient management decisions. Negative results must be combined with clinical observations, patient history, and epidemiological information. The expected result is Negative. Fact Sheet for Patients: SugarRoll.be Fact Sheet for Healthcare Providers: https://www.woods-mathews.com/ This test is not yet approved or cleared by the [place] FDA and  has been authorized for detection and/or diagnosis of SARS-CoV-2 by FDA under an Emergency Use Authorization (EUA). This EUA will remain  in effect (meaning this test can be used) for the duration of the COVID-19 declaration under Section 56 4(b)(1) of the Act, 21 U.S.C. section 360bbb-3(b)(1), unless the authorization is terminated or revoked sooner. Performed at [facility], [place] [address], [place], [place] [address]     Procedures and diagnostic studies:  US Abdomen Complete  Result Date: 12/21/2018 CLINICAL DATA:  Pancreatitis.  Rule out cholelithiasis. EXAM: ABDOMEN ULTRASOUND COMPLETE COMPARISON:  CT abdomen 12/21/2018 FINDINGS: Gallbladder: No gallstones or wall thickening visualized. No sonographic Murphy sign noted by sonographer. Common bile duct: Diameter: 5.9 mm Liver: No focal lesion identified. Within normal limits in parenchymal echogenicity. Portal vein is patent on color Doppler imaging with normal direction of blood flow towards the liver. IVC: No abnormality visualized. Pancreas: Peripancreatic edema best seen by CT. There is a small amount of fluid around the pancreas. Spleen: Size and appearance within normal  limits. Right Kidney: Length: 10.2 cm. Echogenicity within normal limits. No mass or hydronephrosis visualized. Left Kidney: Length: 9.3 cm. Echogenicity within normal limits. No mass or hydronephrosis visualized. Abdominal aorta: No aneurysm visualized. Other findings: Mild amount of free fluid in the abdomen and around the pancreas. IMPRESSION: Negative for gallstones Acute pancreatitis with mild ascites. Electronically Signed   By: Franchot Gallo M.D.   On: 12/21/2018 19:35   Ct Abdomen Pelvis W Contrast  Result Date: 12/21/2018 CLINICAL DATA:  Abdominal pain with nausea and vomiting EXAM: CT ABDOMEN AND PELVIS WITH CONTRAST TECHNIQUE: Multidetector CT imaging of the abdomen and pelvis was performed using the standard protocol following bolus administration of intravenous contrast. CONTRAST:  168mL OMNIPAQUE IOHEXOL 300 MG/ML  SOLN COMPARISON:  None. FINDINGS: Lower chest: Lung bases are clear. Hepatobiliary: No focal liver lesions are appreciable. Gallbladder is borderline distended without gallbladder wall thickening. There is no demonstrable biliary duct dilatation. Pancreas: There is extensive peripancreatic fluid consistent with pancreatitis. Pancreas appears subtly edematous but not enlarged. There is no well-defined pancreatic mass or pseudocyst. There is no pancreatic duct dilatation. No pancreatic calcification. Pancreas enhances without focal necrosis evident. Fluid surrounds the  entire pancreas. On the right, fluid tracks to the right and abuts the gallbladder and upper pole right kidney. Fluid surrounds the first and second portions of the duodenum and to a lesser extent the proximal third portion of the duodenum. To the left of midline, fluid tracks superiorly between the stomach and spleen. Fluid tracks between the spleen and left kidney with fluid surrounding the medial, anterior, and lateral aspects of the left kidney. Fluid tracks inferiorly to the level of the lateral conal fascia on the  left. Fluid tracks more medially on the left to the level of the aorta. Fluid surrounds the more distal duodenum and proximal most aspect of the jejunum. Spleen: No splenic lesions are evident. Adrenals/Urinary Tract: Adrenals appear unremarkable bilaterally. Kidneys bilaterally do not show edema. There is no renal mass or hydronephrosis on either side. There is no evident renal or ureteral calculus on either side. Urinary bladder is midline with wall thickness within normal limits. Stomach/Bowel: The patient is status post gastric bypass grafting. There is no thickening in the walls of the postoperative regions. There is slight thickening in the walls of the distal second and proximal third duodenum with surrounding pancreatic fluid. No other bowel wall thickening is evident. No bowel obstruction present. Terminal ileum appears normal. No evident free air or portal venous air. Vascular/Lymphatic: No abdominal aortic aneurysm. No evident vascular lesions. No adenopathy is appreciable in the abdomen or pelvis. Reproductive: Uterus is anteverted.  No evident pelvic mass. Other: Appendix is diminutive. There is no periappendiceal region inflammatory change. There is no evident abscess in the abdomen or pelvis. Musculoskeletal: No blastic or lytic bone lesions. No intramuscular or abdominal wall lesions are evident. IMPRESSION: 1. Acute pancreatitis with extensive peripancreatic fluid. Pancreas is mildly edematous without pancreatic mass or pseudocyst. No pancreatic duct dilatation. No pancreatic necrosis is appreciable. 2. Status post gastric bypass procedure without complicating features. 3. Wall thickening involving portions of the second and proximal third duodenum, likely due to surrounding peripancreatic fluid. No other bowel wall thickening. No bowel obstruction. No abscess in the abdomen or pelvis. No periappendiceal region inflammation. 4. Gallbladder is borderline distended without focal gallbladder lesion. 5.  No renal or ureteral calculus. No hydronephrosis. Urinary bladder wall thickness normal. Electronically Signed   By: Bretta BangWilliam  Woodruff III M.D.   On: 12/21/2018 14:47    Medications:    enoxaparin (LOVENOX) injection  40 mg Subcutaneous Q24H   lamoTRIgine  100 mg Oral Daily   multivitamin with minerals  1 tablet Oral Daily   propranolol  20 mg Oral BID   QUEtiapine  300 mg Oral QHS   sertraline  100 mg Oral Daily   zonisamide  200 mg Oral QHS   Continuous Infusions:  sodium chloride 150 mL/hr at 12/22/18 1012     LOS: 0 days   Joseph ArtJessica U Tukker Byrns  [facility]   How to contact the [facility] Attending or Consulting provider 7A - 7P or covering provider during after hours 7P -7A, for this patient?  1. Check the care team in [facility] and look for a) attending/consulting [facility] provider listed and b) the [facility] team listed 2. Log into [email or web address] and use 's universal password to access. If you do not have the password, please contact the hospital operator. 3. Locate the [facility] provider you are looking for under [facility] and page to a number that you can be directly reached. 4. If you still have difficulty reaching the provider, please page the [facility] (Director  on Call) for the Hospitalists listed on amion for assistance.  12/22/2018, 11:24 AM

## 2018-12-22 NOTE — Plan of Care (Signed)
  Problem: Education: Goal: Knowledge of General Education information will improve Description Including pain rating scale, medication(s)/side effects and non-pharmacologic comfort measures Outcome: Progressing   Problem: Health Behavior/Discharge Planning: Goal: Ability to manage health-related needs will improve Outcome: Progressing   

## 2018-12-22 NOTE — Progress Notes (Signed)
Pt questioning staff about "commotion" in the halls intermittently throughout the day. Did tell pt no alarm necessary reassured safety. Pt increasingly paranoid about staff without this information for her. Asked this writer: "Are you going to tell me what's going on?". When asked to elaborate pt states she believes her husband and daughter were in the hall making a loud "commotion" which resulted in them "being arrested". Informed pt that this writer has not seen her spouse or daughter. Pt then says she sees "a group of young kids" sitting on the roof. Pt states her husband and daugther were with this group earlier in the day and that she witnessed them throwing things from the roof. Did inspect with assigned NT and no people observed on the roof. Pt is adamant that there are people on her roof and that staff is withholding information from her. Was tearful and visibly shaky. Continued to reassure and reorient to reality. MD notified. VS and assessment noted. New orders obtained. Will continue to monitor.

## 2018-12-22 NOTE — Progress Notes (Signed)
Pt is attempting to call her husband via telephone yet she tells this writer she does not want him to visit her because he is "blocked". When asked to clarify if spouse should visit pt said: "I'll call him". Did inform that this is confusing for writer and no changes made unless pt is clear about requests r/t spouse.

## 2018-12-23 LAB — LIPID PANEL
Cholesterol: 134 mg/dL (ref 0–200)
HDL: 36 mg/dL — ABNORMAL LOW (ref >40)
LDL Cholesterol: 64 mg/dL (ref 0–99)
Total CHOL/HDL Ratio: 3.7 RATIO
Triglycerides: 168 mg/dL — ABNORMAL HIGH (ref <150)
VLDL: 34 mg/dL (ref 0–40)

## 2018-12-23 LAB — COMPREHENSIVE METABOLIC PANEL
ALT: 10 U/L (ref 0–44)
AST: 30 U/L (ref 15–41)
Albumin: 1.9 g/dL — ABNORMAL LOW (ref 3.5–5.0)
Alkaline Phosphatase: 70 U/L (ref 38–126)
Anion gap: 8 (ref 5–15)
BUN: 13 mg/dL (ref 6–20)
CO2: 20 mmol/L — ABNORMAL LOW (ref 22–32)
Calcium: 7.6 mg/dL — ABNORMAL LOW (ref 8.9–10.3)
Chloride: 104 mmol/L (ref 98–111)
Creatinine, Ser: 1.05 mg/dL — ABNORMAL HIGH (ref 0.44–1.00)
GFR calc Af Amer: >60 mL/min (ref >60)
GFR calc non Af Amer: >60 mL/min (ref >60)
Glucose, Bld: 90 mg/dL (ref 70–99)
Potassium: 4.1 mmol/L (ref 3.5–5.1)
Sodium: 132 mmol/L — ABNORMAL LOW (ref 135–145)
Total Bilirubin: 0.7 mg/dL (ref 0.3–1.2)
Total Protein: 4.5 g/dL — ABNORMAL LOW (ref 6.5–8.1)

## 2018-12-23 LAB — CBC
HCT: 40.6 % (ref 36.0–46.0)
Hemoglobin: 13.9 g/dL (ref 12.0–15.0)
MCH: 32.2 pg (ref 26.0–34.0)
MCHC: 34.2 g/dL (ref 30.0–36.0)
MCV: 94 fL (ref 80.0–100.0)
Platelets: 134 10*3/uL — ABNORMAL LOW (ref 150–400)
RBC: 4.32 MIL/uL (ref 3.87–5.11)
RDW: 16.9 % — ABNORMAL HIGH (ref 11.5–15.5)
WBC: 10.5 10*3/uL (ref 4.0–10.5)
nRBC: 0 % (ref 0.0–0.2)

## 2018-12-23 NOTE — Plan of Care (Signed)
°  Problem: Coping: °Goal: Level of anxiety will decrease °Outcome: Progressing °  °

## 2018-12-23 NOTE — Progress Notes (Signed)
Progress Note    Kathryn Rubio  ZOX:[id number] DOB: 12-06-1985  DOA: 12/21/2018 PCP: Patient, No Pcp Per    Brief Narrative:     Medical records reviewed and are as summarized below:  Kathryn Rubio is an 33 y.o. 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  Denies any history of alcoholism denies any similar previous episodes. In the emergency room her lipase was elevated and CT of the abdomen showed evidence of pancreatitis.   Assessment/Plan:   Active Problems:   Pancreatitis   Acute pancreatitis    Severe Acute pancreatitis, first episode -start clears -IVF Pain control -IV phenergan -drank some alcohol over the weekend so I suspect this is the cause -Negative for gallstones -triglycerides only mildly elevated  Anxiety/depression Continue Zoloft and Lamictal  Hypertension Continue with inderal   Family Communication/Anticipated D/C date and plan/Code Status   DVT prophylaxis: Lovenox ordered. Code Status: Full Code.  Family Communication: asked not to call family Disposition Plan: IVF, slowly advance diet  Medical Consultants:    None.    Subjective:   Asking for xanax and soft foods  Objective:    Vitals:   12/22/18 1804 12/22/18 2035 12/23/18 0444 12/23/18 0905  BP: (!) 111/94 122/86 119/77 108/80  Pulse: 88 96 99 99  Resp: 17 18 18 18   Temp: 98.4 F (36.9 C) 98 F (36.7 C) 98.4 F (36.9 C) 98.9 F (37.2 C)  TempSrc: Oral Oral  Oral  SpO2: 100% 99% 97% 99%  Weight:      Height:        Intake/Output Summary (Last 24 hours) at 12/23/2018 1108 Last data filed at 12/23/2018 0700 Gross per 24 hour  Intake 2910.93 ml  Output 1000 ml  Net 1910.93 ml   Filed Weights   12/21/18 1353 12/21/18 2118  Weight: 79.4 kg 79.2 kg    Exam: Odd affect Evasive when asked  questions rrr +BS, tender to deep palpation  Data Reviewed:   I have personally reviewed following labs and imaging studies:  Labs: Labs show the following:   Basic Metabolic Panel: Recent Labs  Lab 12/21/18 1203 12/23/18 0714  NA 135 132*  K 3.6 4.1  CL 104 104  CO2 22 20*  GLUCOSE 126* 90  BUN 13 13  CREATININE 0.86 1.05*  CALCIUM 8.7* 7.6*   GFR Estimated Creatinine Clearance: 79.3 mL/min (A) (by C-G formula based on SCr of 1.05 mg/dL (H)). Liver Function Tests: Recent Labs  Lab 12/21/18 1203 12/23/18 0714  AST 34 30  ALT 19 10  ALKPHOS 87 70  BILITOT 0.9 0.7  PROT 5.4* 4.5*  ALBUMIN 3.0* 1.9*   Recent Labs  Lab 12/21/18 1203  LIPASE 514*   No results for input(s): AMMONIA in the last 168 hours. Coagulation profile No results for input(s): INR, PROTIME in the last 168 hours.  CBC: Recent Labs  Lab 12/21/18 1203 12/23/18 0714  WBC 14.3* 10.5  HGB 16.5* 13.9  HCT 48.9* 40.6  MCV 93.1 94.0  PLT 239 134*   Cardiac Enzymes: No results for input(s): CKTOTAL, CKMB, CKMBINDEX, TROPONINI in the last 168 hours. BNP (last 3 results) No results for input(s): PROBNP in the last 8760 hours. CBG: No results for input(s): GLUCAP in the last 168 hours. D-Dimer: No results for input(s): DDIMER in the last 72 hours. Hgb A1c: No  results for input(s): HGBA1C in the last 72 hours. Lipid Profile: Recent Labs    12/23/18 0714  CHOL 134  HDL 36*  LDLCALC 64  TRIG 161168*  CHOLHDL 3.7   Thyroid function studies: No results for input(s): TSH, T4TOTAL, T3FREE, THYROIDAB in the last 72 hours.  Invalid input(s): FREET3 Anemia work up: No results for input(s): VITAMINB12, FOLATE, FERRITIN, TIBC, IRON, RETICCTPCT in the last 72 hours. Sepsis Labs: Recent Labs  Lab 12/21/18 1203 12/23/18 0714  WBC 14.3* 10.5    Microbiology Recent Results (from the past 240 hour(s))  SARS CORONAVIRUS 2 (TAT 6-24 HRS) Nasopharyngeal Nasopharyngeal Swab     Status: None    Collection Time: 12/21/18  4:21 PM   Specimen: Nasopharyngeal Swab  Result Value Ref Range Status   SARS Coronavirus 2 NEGATIVE NEGATIVE Final    Comment: (NOTE) SARS-CoV-2 target nucleic acids are NOT DETECTED. The SARS-CoV-2 RNA is generally detectable in upper and lower respiratory specimens during the acute phase of infection. Negative results do not preclude SARS-CoV-2 infection, do not rule out co-infections with other pathogens, and should not be used as the sole basis for treatment or other patient management decisions. Negative results must be combined with clinical observations, patient history, and epidemiological information. The expected result is Negative. Fact Sheet for Patients: HairSlick.nohttps://www.fda.gov/media/138098/download Fact Sheet for Healthcare Providers: quierodirigir.comhttps://www.fda.gov/media/138095/download This test is not yet approved or cleared by the [place] FDA and  has been authorized for detection and/or diagnosis of SARS-CoV-2 by FDA under an Emergency Use Authorization (EUA). This EUA will remain  in effect (meaning this test can be used) for the duration of the COVID-19 declaration under Section 56 4(b)(1) of the Act, 21 U.S.C. section 360bbb-3(b)(1), unless the authorization is terminated or revoked sooner. Performed at [facility], [address] [place], [place] [address]     Procedures and diagnostic studies:  [place] Abdomen Complete  Result Date: 12/21/2018 CLINICAL DATA:  Pancreatitis.  Rule out cholelithiasis. EXAM: ABDOMEN ULTRASOUND COMPLETE COMPARISON:  CT abdomen 12/21/2018 FINDINGS: Gallbladder: No gallstones or wall thickening visualized. No sonographic Murphy sign noted by sonographer. Common bile duct: Diameter: 5.9 mm Liver: No focal lesion identified. Within normal limits in parenchymal echogenicity. Portal vein is patent on color Doppler imaging with normal direction of blood flow towards the liver. IVC: No abnormality visualized.  Pancreas: Peripancreatic edema best seen by CT. There is a small amount of fluid around the pancreas. Spleen: Size and appearance within normal limits. Right Kidney: Length: 10.2 cm. Echogenicity within normal limits. No mass or hydronephrosis visualized. Left Kidney: Length: 9.3 cm. Echogenicity within normal limits. No mass or hydronephrosis visualized. Abdominal aorta: No aneurysm visualized. Other findings: Mild amount of free fluid in the abdomen and around the pancreas. IMPRESSION: Negative for gallstones Acute pancreatitis with mild ascites. Electronically Signed   By: Marlan Palauharles  Clark M.D.   On: 12/21/2018 19:35   Ct Abdomen Pelvis W Contrast  Result Date: 12/21/2018 CLINICAL DATA:  Abdominal pain with nausea and vomiting EXAM: CT ABDOMEN AND PELVIS WITH CONTRAST TECHNIQUE: Multidetector CT imaging of the abdomen and pelvis was performed using the standard protocol following bolus administration of intravenous contrast. CONTRAST:  100mL OMNIPAQUE IOHEXOL 300 MG/ML  SOLN COMPARISON:  None. FINDINGS: Lower chest: Lung bases are clear. Hepatobiliary: No focal liver lesions are appreciable. Gallbladder is borderline distended without gallbladder wall thickening. There is no demonstrable biliary duct dilatation. Pancreas: There is extensive peripancreatic fluid consistent with pancreatitis. Pancreas appears subtly edematous but  not enlarged. There is no well-defined pancreatic mass or pseudocyst. There is no pancreatic duct dilatation. No pancreatic calcification. Pancreas enhances without focal necrosis evident. Fluid surrounds the entire pancreas. On the right, fluid tracks to the right and abuts the gallbladder and upper pole right kidney. Fluid surrounds the first and second portions of the duodenum and to a lesser extent the proximal third portion of the duodenum. To the left of midline, fluid tracks superiorly between the stomach and spleen. Fluid tracks between the spleen and left kidney with fluid  surrounding the medial, anterior, and lateral aspects of the left kidney. Fluid tracks inferiorly to the level of the lateral conal fascia on the left. Fluid tracks more medially on the left to the level of the aorta. Fluid surrounds the more distal duodenum and proximal most aspect of the jejunum. Spleen: No splenic lesions are evident. Adrenals/Urinary Tract: Adrenals appear unremarkable bilaterally. Kidneys bilaterally do not show edema. There is no renal mass or hydronephrosis on either side. There is no evident renal or ureteral calculus on either side. Urinary bladder is midline with wall thickness within normal limits. Stomach/Bowel: The patient is status post gastric bypass grafting. There is no thickening in the walls of the postoperative regions. There is slight thickening in the walls of the distal second and proximal third duodenum with surrounding pancreatic fluid. No other bowel wall thickening is evident. No bowel obstruction present. Terminal ileum appears normal. No evident free air or portal venous air. Vascular/Lymphatic: No abdominal aortic aneurysm. No evident vascular lesions. No adenopathy is appreciable in the abdomen or pelvis. Reproductive: Uterus is anteverted.  No evident pelvic mass. Other: Appendix is diminutive. There is no periappendiceal region inflammatory change. There is no evident abscess in the abdomen or pelvis. Musculoskeletal: No blastic or lytic bone lesions. No intramuscular or abdominal wall lesions are evident. IMPRESSION: 1. Acute pancreatitis with extensive peripancreatic fluid. Pancreas is mildly edematous without pancreatic mass or pseudocyst. No pancreatic duct dilatation. No pancreatic necrosis is appreciable. 2. Status post gastric bypass procedure without complicating features. 3. Wall thickening involving portions of the second and proximal third duodenum, likely due to surrounding peripancreatic fluid. No other bowel wall thickening. No bowel obstruction. No  abscess in the abdomen or pelvis. No periappendiceal region inflammation. 4. Gallbladder is borderline distended without focal gallbladder lesion. 5. No renal or ureteral calculus. No hydronephrosis. Urinary bladder wall thickness normal. Electronically Signed   By: Bretta Bang III M.D.   On: 12/21/2018 14:47    Medications:    enoxaparin (LOVENOX) injection  40 mg Subcutaneous Q24H   folic acid  1 mg Oral Daily   lamoTRIgine  100 mg Oral Daily   multivitamin with minerals  1 tablet Oral Daily   propranolol  20 mg Oral BID   QUEtiapine  300 mg Oral QHS   sertraline  100 mg Oral Daily   thiamine  100 mg Oral Daily   Or   thiamine  100 mg Intravenous Daily   zonisamide  200 mg Oral QHS   Continuous Infusions:  sodium chloride 150 mL/hr at 12/22/18 1748     LOS: 1 day   Joseph Art  [facility]   How to contact the [facility] Attending or Consulting provider 7A - 7P or covering provider during after hours 7P -7A, for this patient?  1. Check the care team in [facility] and look for a) attending/consulting TRH provider listed and b) the [facility] team listed 2. Log into [email or web address] and  use [place]'s universal password to access. If you do not have the password, please contact the hospital operator. 3. Locate the [facility] provider you are looking for under [facility] and page to a number that you can be directly reached. 4. If you still have difficulty reaching the provider, please page the [facility] (Director on Call) for the Hospitalists listed on amion for assistance.  12/23/2018, 11:08 AM

## 2018-12-24 MED ORDER — PROMETHAZINE HCL 25 MG/ML IJ SOLN
6.2500 mg | Freq: Three times a day (TID) | INTRAMUSCULAR | Status: DC | PRN
  Administered 2018-12-24 – 2018-12-25 (×2): 6.25 mg via INTRAVENOUS
  Filled 2018-12-24 (×2): qty 1

## 2018-12-24 MED ORDER — SODIUM CHLORIDE 0.9 % IV BOLUS
1000.0000 mL | Freq: Once | INTRAVENOUS | Status: AC
  Administered 2018-12-24: 1000 mL via INTRAVENOUS

## 2018-12-24 MED ORDER — OXYCODONE HCL 5 MG PO TABS
5.0000 mg | ORAL_TABLET | Freq: Four times a day (QID) | ORAL | Status: DC | PRN
  Administered 2018-12-24 – 2018-12-25 (×3): 5 mg via ORAL
  Filled 2018-12-24 (×3): qty 1

## 2018-12-24 MED ORDER — HYDROMORPHONE HCL 1 MG/ML IJ SOLN
0.5000 mg | INTRAMUSCULAR | Status: DC | PRN
  Administered 2018-12-24 – 2018-12-25 (×4): 0.5 mg via INTRAVENOUS
  Filled 2018-12-24 (×4): qty 1

## 2018-12-24 MED ORDER — SENNOSIDES-DOCUSATE SODIUM 8.6-50 MG PO TABS
1.0000 | ORAL_TABLET | Freq: Two times a day (BID) | ORAL | Status: DC
  Administered 2018-12-24 – 2018-12-26 (×5): 1 via ORAL
  Filled 2018-12-24 (×5): qty 1

## 2018-12-24 NOTE — Progress Notes (Signed)
Spoke with Crystal, RN, House Coverage re: previous note, pt refusal of bed alarm. Explained again, in detail, to patient, our concern for her falling with regard to her meds given, IV pump and need for bathroom. Patient stated she understood and did not want bed alarm placed.

## 2018-12-24 NOTE — Progress Notes (Signed)
Progress Note    Kathryn Rubio  AST:[id number] DOB: 1985/07/05  DOA: 12/21/2018 PCP: Patient, No Pcp Per    Brief Narrative:     Medical records reviewed and are as summarized below:  Kathryn Rubio is an 33 y.o. 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  Denies any history of alcoholism denies any similar previous episodes. In the emergency room her lipase was elevated and CT of the abdomen showed evidence of pancreatitis.   Assessment/Plan:   Active Problems:   Pancreatitis   Acute pancreatitis    Severe Acute pancreatitis, first episode -advance to full -IVF- decrease Pain control- since diet is being advanced, will  -IV phenergan -drank some alcohol over the weekend so I suspect this is the cause (also patient left alcohol rehab 2 months ago per aunt-- patient is VERY evasive about prior use) -Negative for gallstones -triglycerides only mildly elevated -will not d/c patient with pain medications  Anxiety/depression Continue Zoloft and Lamictal  Hypertension Continue with inderal   Family Communication/Anticipated D/C date and plan/Code Status   DVT prophylaxis: Lovenox ordered. Code Status: Full Code.  Family Communication: attempted to call mother- NA Disposition Plan: advance diet-- home 24-48 hours  Medical Consultants:    None.    Subjective:   Upset I was made aware of her recent treatment at rehab  Objective:    Vitals:   12/23/18 0905 12/23/18 1701 12/23/18 2054 12/24/18 0928  BP: 108/80 104/83 110/77 108/75  Pulse: 99 91 (!) 103 (!) 105  Resp: 18 18 18 18   Temp: 98.9 F (37.2 C) 98.7 F (37.1 C) 98.7 F (37.1 C) 98.8 F (37.1 C)  TempSrc: Oral Oral Oral Oral  SpO2: 99% 100% 99% 97%  Weight:      Height:        Intake/Output Summary (Last 24 hours) at  12/24/2018 1430 Last data filed at 12/24/2018 0900 Gross per 24 hour  Intake 3662.14 ml  Output 500 ml  Net 3162.14 ml   Filed Weights   12/21/18 1353 12/21/18 2118  Weight: 79.4 kg 79.2 kg    Exam: Evasive and hostile  +BS, less tender to palpation  Data Reviewed:   I have personally reviewed following labs and imaging studies:  Labs: Labs show the following:   Basic Metabolic Panel: Recent Labs  Lab 12/21/18 1203 12/23/18 0714  NA 135 132*  K 3.6 4.1  CL 104 104  CO2 22 20*  GLUCOSE 126* 90  BUN 13 13  CREATININE 0.86 1.05*  CALCIUM 8.7* 7.6*   GFR Estimated Creatinine Clearance: 79.3 mL/min (A) (by C-G formula based on SCr of 1.05 mg/dL (H)). Liver Function Tests: Recent Labs  Lab 12/21/18 1203 12/23/18 0714  AST 34 30  ALT 19 10  ALKPHOS 87 70  BILITOT 0.9 0.7  PROT 5.4* 4.5*  ALBUMIN 3.0* 1.9*   Recent Labs  Lab 12/21/18 1203  LIPASE 514*   No results for input(s): AMMONIA in the last 168 hours. Coagulation profile No results for input(s): INR, PROTIME in the last 168 hours.  CBC: Recent Labs  Lab 12/21/18 1203 12/23/18 0714  WBC 14.3* 10.5  HGB 16.5* 13.9  HCT 48.9* 40.6  MCV 93.1 94.0  PLT 239 134*   Cardiac Enzymes: No results for input(s): CKTOTAL, CKMB, CKMBINDEX, TROPONINI in the last 168 hours. BNP (last  3 results) No results for input(s): PROBNP in the last 8760 hours. CBG: No results for input(s): GLUCAP in the last 168 hours. D-Dimer: No results for input(s): DDIMER in the last 72 hours. Hgb A1c: No results for input(s): HGBA1C in the last 72 hours. Lipid Profile: Recent Labs    12/23/18 0714  CHOL 134  HDL 36*  LDLCALC 64  TRIG 161168*  CHOLHDL 3.7   Thyroid function studies: No results for input(s): TSH, T4TOTAL, T3FREE, THYROIDAB in the last 72 hours.  Invalid input(s): FREET3 Anemia work up: No results for input(s): VITAMINB12, FOLATE, FERRITIN, TIBC, IRON, RETICCTPCT in the last 72 hours. Sepsis Labs:  Recent Labs  Lab 12/21/18 1203 12/23/18 0714  WBC 14.3* 10.5    Microbiology Recent Results (from the past 240 hour(s))  SARS CORONAVIRUS 2 (TAT 6-24 HRS) Nasopharyngeal Nasopharyngeal Swab     Status: None   Collection Time: 12/21/18  4:21 PM   Specimen: Nasopharyngeal Swab  Result Value Ref Range Status   SARS Coronavirus 2 NEGATIVE NEGATIVE Final    Comment: (NOTE) SARS-CoV-2 target nucleic acids are NOT DETECTED. The SARS-CoV-2 RNA is generally detectable in upper and lower respiratory specimens during the acute phase of infection. Negative results do not preclude SARS-CoV-2 infection, do not rule out co-infections with other pathogens, and should not be used as the sole basis for treatment or other patient management decisions. Negative results must be combined with clinical observations, patient history, and epidemiological information. The expected result is Negative. Fact Sheet for Patients: HairSlick.nohttps://www.fda.gov/media/138098/download Fact Sheet for Healthcare Providers: quierodirigir.comhttps://www.fda.gov/media/138095/download This test is not yet approved or cleared by the [place] FDA and  has been authorized for detection and/or diagnosis of SARS-CoV-2 by FDA under an Emergency Use Authorization (EUA). This EUA will remain  in effect (meaning this test can be used) for the duration of the COVID-19 declaration under Section 56 4(b)(1) of the Act, 21 U.S.C. section 360bbb-3(b)(1), unless the authorization is terminated or revoked sooner. Performed at [facility], [address] [place], [place] [address]     Procedures and diagnostic studies:  No results found.  Medications:   . enoxaparin (LOVENOX) injection  40 mg Subcutaneous Q24H  . folic acid  1 mg Oral Daily  . lamoTRIgine  100 mg Oral Daily  . multivitamin with minerals  1 tablet Oral Daily  . propranolol  20 mg Oral BID  . QUEtiapine  300 mg Oral QHS  . senna-docusate  1 tablet Oral BID  .  sertraline  100 mg Oral Daily  . thiamine  100 mg Oral Daily   Or  . thiamine  100 mg Intravenous Daily  . zonisamide  200 mg Oral QHS   Continuous Infusions: . sodium chloride 100 mL/hr at 12/24/18 1134     LOS: 2 days   Kathryn ArtJessica U Storie Rubio  [facility]   How to contact the [facility] Attending or Consulting provider 7A - 7P or covering provider during after hours 7P -7A, for this patient?  1. Check the care team in [facility] and look for a) attending/consulting TRH provider listed and b) the [facility] team listed 2. Log into [email or web address] and use [place]'s universal password to access. If you do not have the password, please contact the hospital operator. 3. Locate the [facility] provider you are looking for under [facility] and page to a number that you can be directly reached. 4. If you still have difficulty reaching the provider, please page the [facility] (Director  on Call) for the Hospitalists listed on amion for assistance.  12/24/2018, 2:30 PM

## 2018-12-24 NOTE — Progress Notes (Signed)
CSW met with the patient at bedside. CSW introduced herself and explained her role. The patient declined to speak with the CSW and refused substance abuse resources.   CSW informed the RN. CSW left resources with RN should she change her mind.   CSW is signing off for now, should any more issues arise please re-consult.  Domenic Schwab, MSW, [place] Worker [facility]  [phone]

## 2018-12-24 NOTE — Progress Notes (Signed)
[place],NP texted re: T 100.4, loss of IV access, pt requesting Oxy IR 5mg  po now for abdominal pain, level of 7, but also wants IV Dilaudid and Phenergan at 2118 (next time can have). Patient also refusing bed alarm (this RN feels pt is at risk of fall with narcotics,antiemetic,Seroquel, Zonegran, IV fluids infusing and semi formed, frequent BMs. Awaiting orders.

## 2018-12-24 NOTE — Plan of Care (Signed)
  Problem: Activity: Goal: Risk for activity intolerance will decrease Outcome: Progressing   

## 2018-12-25 ENCOUNTER — Inpatient Hospital Stay (HOSPITAL_COMMUNITY): Payer: Medicaid Other

## 2018-12-25 MED ORDER — PROMETHAZINE HCL 25 MG PO TABS
12.5000 mg | ORAL_TABLET | Freq: Four times a day (QID) | ORAL | Status: DC | PRN
  Administered 2018-12-25: 12.5 mg via ORAL
  Filled 2018-12-25: qty 1

## 2018-12-25 NOTE — Progress Notes (Signed)
Patient manipulating IV pump. Entered room to find pump turned completely off. Patient had unplugged pump to go to bathroom. Stated it had been alarming about an hour but patient did not let RN know nor call front desk. Patient had also wiped message board in room completely clean. IV pumped locked.

## 2018-12-25 NOTE — Progress Notes (Addendum)
Progress Note    Kathryn Rubio  BJY:[id number] DOB: 06-28-1985  DOA: 12/21/2018 PCP: Patient, No Pcp Per    Brief Narrative:     Medical records reviewed and are as summarized below:  Kathryn Rubio is an 33 y.o. 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  Patient initially denied any alcohol use but then admitted to alcohol use over the weekend prior.  Later during the stay family reported recent stay in rehab for alcohol.  Then patient was found to be manipulating IV pump.  Family also reports that she has a court date on 9/15.  Patient continues to be hostile and interfere with care- [facility] walk [facility]   Assessment/Plan:   Active Problems:   Pancreatitis   Acute pancreatitis    Severe Acute pancreatitis, first episode per patient -suspect due to alcohol abuse -pain overall improved and patient tolerating diet  -IVF stopped.   -Po pain medications avoid IV -phenergan PRN -U/S Negative for gallstones -triglycerides only mildly elevated -on 9/14 developed small pleural effusions- ? If this is from IVF for pancreatitis vs the pancreatitis itself- will continue to monitor closely -home O2 study- not on O2 at rest- no increased work of breathing -incentive spirometry -ambulate patient (has been refusing) -if worsening, in AM consider repeat CT scan and GI consult -could eventually need thoracentesis if worsens  Anxiety/depression Continue Zoloft and Lamictal  Hypertension Continue with inderal   Family Communication/Anticipated D/C date and plan/Code Status   DVT prophylaxis: Lovenox ordered. Code Status: Full Code.  Family Communication: patient did not want me to call family Disposition Plan:   Medical Consultants:    None.    Subjective:   Feels like her chest is rattling    Objective:    Vitals:   12/24/18 2159  12/25/18 0245 12/25/18 0502 12/25/18 0853  BP: 108/78  111/76 113/72  Pulse: (!) 110  (!) 105 (!) 118  Resp: 16  18 18   Temp: 98.8 F (37.1 C)  97.8 F (36.6 C) 99.1 F (37.3 C)  TempSrc: Oral  Oral Oral  SpO2: 99%  96% 94%  Weight:  92.8 kg    Height:        Intake/Output Summary (Last 24 hours) at 12/25/2018 1511 Last data filed at 12/25/2018 0900 Gross per 24 hour  Intake 2489.28 ml  Output 0 ml  Net 2489.28 ml   Filed Weights   12/21/18 1353 12/21/18 2118 12/25/18 0245  Weight: 79.4 kg 79.2 kg 92.8 kg    Exam: Continues to be hostile, evasive, and not answer questions truthfully Crackles at bases No increased work of breathing  Data Reviewed:   I have personally reviewed following labs and imaging studies:  Labs: Labs show the following:   Basic Metabolic Panel: Recent Labs  Lab 12/21/18 1203 12/23/18 0714  NA 135 132*  K 3.6 4.1  CL 104 104  CO2 22 20*  GLUCOSE 126* 90  BUN 13 13  CREATININE 0.86 1.05*  CALCIUM 8.7* 7.6*   GFR Estimated Creatinine Clearance: 85.8 mL/min (A) (by C-G formula based on SCr of 1.05 mg/dL (H)). Liver Function Tests: Recent Labs  Lab 12/21/18 1203 12/23/18 0714  AST 34 30  ALT 19 10  ALKPHOS 87 70  BILITOT 0.9 0.7  PROT 5.4* 4.5*  ALBUMIN 3.0* 1.9*   Recent Labs  Lab 12/21/18 1203  LIPASE 514*  No results for input(s): AMMONIA in the last 168 hours. Coagulation profile No results for input(s): INR, PROTIME in the last 168 hours.  CBC: Recent Labs  Lab 12/21/18 1203 12/23/18 0714  WBC 14.3* 10.5  HGB 16.5* 13.9  HCT 48.9* 40.6  MCV 93.1 94.0  PLT 239 134*   Cardiac Enzymes: No results for input(s): CKTOTAL, CKMB, CKMBINDEX, TROPONINI in the last 168 hours. BNP (last 3 results) No results for input(s): PROBNP in the last 8760 hours. CBG: No results for input(s): GLUCAP in the last 168 hours. D-Dimer: No results for input(s): DDIMER in the last 72 hours. Hgb A1c: No results for input(s): HGBA1C  in the last 72 hours. Lipid Profile: Recent Labs    12/23/18 0714  CHOL 134  HDL 36*  LDLCALC 64  TRIG 168*  CHOLHDL 3.7   Thyroid function studies: No results for input(s): TSH, T4TOTAL, T3FREE, THYROIDAB in the last 72 hours.  Invalid input(s): FREET3 Anemia work up: No results for input(s): VITAMINB12, FOLATE, FERRITIN, TIBC, IRON, RETICCTPCT in the last 72 hours. Sepsis Labs: Recent Labs  Lab 12/21/18 1203 12/23/18 0714  WBC 14.3* 10.5    Microbiology Recent Results (from the past 240 hour(s))  SARS CORONAVIRUS 2 (TAT 6-24 HRS) Nasopharyngeal Nasopharyngeal Swab     Status: None   Collection Time: 12/21/18  4:21 PM   Specimen: Nasopharyngeal Swab  Result Value Ref Range Status   SARS Coronavirus 2 NEGATIVE NEGATIVE Final    Comment: (NOTE) SARS-CoV-2 target nucleic acids are NOT DETECTED. The SARS-CoV-2 RNA is generally detectable in upper and lower respiratory specimens during the acute phase of infection. Negative results do not preclude SARS-CoV-2 infection, do not rule out co-infections with other pathogens, and should not be used as the sole basis for treatment or other patient management decisions. Negative results must be combined with clinical observations, patient history, and epidemiological information. The expected result is Negative. Fact Sheet for Patients: SugarRoll.be Fact Sheet for Healthcare Providers: https://www.woods-mathews.com/ This test is not yet approved or cleared by the [place] FDA and  has been authorized for detection and/or diagnosis of SARS-CoV-2 by FDA under an Emergency Use Authorization (EUA). This EUA will remain  in effect (meaning this test can be used) for the duration of the COVID-19 declaration under Section 56 4(b)(1) of the Act, 21 U.S.C. section 360bbb-3(b)(1), unless the authorization is terminated or revoked sooner. Performed at [facility], [place] [address], [place], [place] [address]     Procedures and diagnostic studies:  Dg Chest Port 1 View  Result Date: 12/25/2018 CLINICAL DATA:  Cough EXAM: PORTABLE CHEST 1 VIEW COMPARISON:  None. FINDINGS: The heart size and mediastinal contours are within normal limits. Small bilateral pleural effusions, left greater than right, and associated atelectasis or consolidation. The visualized skeletal structures are unremarkable. IMPRESSION: Small bilateral pleural effusions, left greater than right, and associated atelectasis or consolidation. Electronically Signed   By: Eddie Candle M.D.   On: 12/25/2018 14:50    Medications:   . enoxaparin (LOVENOX) injection  40 mg Subcutaneous Q24H  . folic acid  1 mg Oral Daily  . lamoTRIgine  100 mg Oral Daily  . multivitamin with minerals  1 tablet Oral Daily  . propranolol  20 mg Oral BID  . QUEtiapine  300 mg Oral QHS  . senna-docusate  1 tablet Oral BID  . sertraline  100 mg Oral Daily  . thiamine  100 mg Oral Daily   Or  .  thiamine  100 mg Intravenous Daily  . zonisamide  200 mg Oral QHS   Continuous Infusions:    LOS: 3 days   Joseph Art  [facility]   How to contact the [facility] Attending or Consulting provider 7A - 7P or covering provider during after hours 7P -7A, for this patient?  1. Check the care team in [facility] and look for a) attending/consulting [facility] provider listed and b) the [facility] team listed 2. Log into [email or web address] and use [place]'s universal password to access. If you do not have the password, please contact the hospital operator. 3. Locate the [facility] provider you are looking for under [facility] and page to a number that you can be directly reached. 4. If you still have difficulty reaching the provider, please page the [facility] (Director on Call) for the Hospitalists listed on amion for assistance.  12/25/2018, 3:11 PM

## 2018-12-25 NOTE — Plan of Care (Signed)
  Problem: Health Behavior/Discharge Planning: Goal: Ability to manage health-related needs will improve Outcome: Progressing   

## 2018-12-26 MED ORDER — GUAIFENESIN 100 MG/5ML PO SOLN: 5.0000 mL | ORAL | 0 refills | Status: DC | PRN

## 2018-12-26 MED ORDER — GUAIFENESIN 100 MG/5ML PO SOLN: 5.0000 mL | ORAL | Status: DC | PRN

## 2018-12-26 NOTE — Discharge Summary (Signed)
Physician Discharge Summary  Kathryn Rubio [id number] DOB: May 16, 1985 DOA: 12/21/2018  PCP: Patient, No Pcp Per  Admit date: 12/21/2018 Discharge date: 12/26/2018  Admitted From: home Discharge disposition: home   Recommendations for Outpatient Follow-Up:   1. Patient declined any resources for alcohol abuse such as rehabilitation-- she knows that if she continues to drink, her pancrease will worsen   Discharge Diagnosis:   Active Problems:   Pancreatitis   Acute pancreatitis    Discharge Condition: Improved.  Diet recommendation: soft diet  Wound care: None.  Code status: Full.   History of Present Illness:   Kathryn Rubio  is a 33 y.o. 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  Denies any history of alcoholism denies any similar previous episodes. In the emergency room her lipase was elevated and CT of the abdomen showed evidence of pancreatitis. Ultrasound of abdomen was ordered and pending.   Hospital Course by Problem:   Severe Acute pancreatitis, first episode per patient -suspect due to alcohol abuse -pain overall improved and patient tolerating diet  -IVF stopped.   -pain resolved and is eating well -phenergan PRN -U/S Negative for gallstones -triglycerides only mildly elevated -on 9/14 developed small pleural effusions- ? If this is from IVF for pancreatitis vs the pancreatitis itself- on AM of discharge, her symptoms were much improved and no elevation of temperature -refused home o2 study -incentive spirometry -ambulate patient (has been refusing) --strongly encouraged alcohol cessation  Anxiety/depression Continue Zoloft and Lamictal  Hypertension Continue withinderal    Medical Consultants:   Social work   Discharge Exam:   Vitals:   12/26/18 0513  12/26/18 0949  BP: 116/81 117/88  Pulse: 96 93  Resp: 18 18  Temp: 98.6 F (37 C) 99 F (37.2 C)  SpO2: 96% 100%   Vitals:   12/25/18 1656 12/25/18 2130 12/26/18 0513 12/26/18 0949  BP: (!) 130/93 121/86 116/81 117/88  Pulse: 97 98 96 93  Resp: 18 18 18 18   Temp: 98.8 F (37.1 C) 98.7 F (37.1 C) 98.6 F (37 C) 99 F (37.2 C)  TempSrc: Oral Oral Oral Oral  SpO2: 100% 97% 96% 100%  Weight:      Height:        General exam: poor eye contact- happy to be going home   The results of significant diagnostics from this hospitalization (including imaging, microbiology, ancillary and laboratory) are listed below for reference.     Procedures and Diagnostic Studies:   [place] Abdomen Complete  Result Date: 12/21/2018 CLINICAL DATA:  Pancreatitis.  Rule out cholelithiasis. EXAM: ABDOMEN ULTRASOUND COMPLETE COMPARISON:  CT abdomen 12/21/2018 FINDINGS: Gallbladder: No gallstones or wall thickening visualized. No sonographic Murphy sign noted by sonographer. Common bile duct: Diameter: 5.9 mm Liver: No focal lesion identified. Within normal limits in parenchymal echogenicity. Portal vein is patent on color Doppler imaging with normal direction of blood flow towards the liver. IVC: No abnormality visualized. Pancreas: Peripancreatic edema best seen by CT. There is a small amount of fluid around the pancreas. Spleen: Size and appearance within normal limits. Right Kidney: Length: 10.2 cm. Echogenicity within normal limits. No mass or hydronephrosis visualized. Left Kidney: Length: 9.3 cm. Echogenicity within normal limits. No mass or hydronephrosis visualized. Abdominal aorta: No aneurysm visualized. Other findings: Mild amount of free fluid in the abdomen and around the pancreas.  IMPRESSION: Negative for gallstones Acute pancreatitis with mild ascites. Electronically Signed   By: Marlan Palauharles  Clark M.D.   On: 12/21/2018 19:35   Ct Abdomen Pelvis W Contrast  Result Date: 12/21/2018 CLINICAL DATA:   Abdominal pain with nausea and vomiting EXAM: CT ABDOMEN AND PELVIS WITH CONTRAST TECHNIQUE: Multidetector CT imaging of the abdomen and pelvis was performed using the standard protocol following bolus administration of intravenous contrast. CONTRAST:  100mL OMNIPAQUE IOHEXOL 300 MG/ML  SOLN COMPARISON:  None. FINDINGS: Lower chest: Lung bases are clear. Hepatobiliary: No focal liver lesions are appreciable. Gallbladder is borderline distended without gallbladder wall thickening. There is no demonstrable biliary duct dilatation. Pancreas: There is extensive peripancreatic fluid consistent with pancreatitis. Pancreas appears subtly edematous but not enlarged. There is no well-defined pancreatic mass or pseudocyst. There is no pancreatic duct dilatation. No pancreatic calcification. Pancreas enhances without focal necrosis evident. Fluid surrounds the entire pancreas. On the right, fluid tracks to the right and abuts the gallbladder and upper pole right kidney. Fluid surrounds the first and second portions of the duodenum and to a lesser extent the proximal third portion of the duodenum. To the left of midline, fluid tracks superiorly between the stomach and spleen. Fluid tracks between the spleen and left kidney with fluid surrounding the medial, anterior, and lateral aspects of the left kidney. Fluid tracks inferiorly to the level of the lateral conal fascia on the left. Fluid tracks more medially on the left to the level of the aorta. Fluid surrounds the more distal duodenum and proximal most aspect of the jejunum. Spleen: No splenic lesions are evident. Adrenals/Urinary Tract: Adrenals appear unremarkable bilaterally. Kidneys bilaterally do not show edema. There is no renal mass or hydronephrosis on either side. There is no evident renal or ureteral calculus on either side. Urinary bladder is midline with wall thickness within normal limits. Stomach/Bowel: The patient is status post gastric bypass grafting. There  is no thickening in the walls of the postoperative regions. There is slight thickening in the walls of the distal second and proximal third duodenum with surrounding pancreatic fluid. No other bowel wall thickening is evident. No bowel obstruction present. Terminal ileum appears normal. No evident free air or portal venous air. Vascular/Lymphatic: No abdominal aortic aneurysm. No evident vascular lesions. No adenopathy is appreciable in the abdomen or pelvis. Reproductive: Uterus is anteverted.  No evident pelvic mass. Other: Appendix is diminutive. There is no periappendiceal region inflammatory change. There is no evident abscess in the abdomen or pelvis. Musculoskeletal: No blastic or lytic bone lesions. No intramuscular or abdominal wall lesions are evident. IMPRESSION: 1. Acute pancreatitis with extensive peripancreatic fluid. Pancreas is mildly edematous without pancreatic mass or pseudocyst. No pancreatic duct dilatation. No pancreatic necrosis is appreciable. 2. Status post gastric bypass procedure without complicating features. 3. Wall thickening involving portions of the second and proximal third duodenum, likely due to surrounding peripancreatic fluid. No other bowel wall thickening. No bowel obstruction. No abscess in the abdomen or pelvis. No periappendiceal region inflammation. 4. Gallbladder is borderline distended without focal gallbladder lesion. 5. No renal or ureteral calculus. No hydronephrosis. Urinary bladder wall thickness normal. Electronically Signed   By: Bretta BangWilliam  Woodruff III M.D.   On: 12/21/2018 14:47     Labs:   Basic Metabolic Panel: Recent Labs  Lab 12/21/18 1203 12/23/18 0714  NA 135 132*  K 3.6 4.1  CL 104 104  CO2 22 20*  GLUCOSE 126* 90  BUN 13 13  CREATININE 0.86 1.05*  CALCIUM 8.7* 7.6*   GFR Estimated Creatinine Clearance: 85.8 mL/min (A) (by C-G formula based on SCr of 1.05 mg/dL (H)). Liver Function Tests: Recent Labs  Lab 12/21/18 1203 12/23/18 0714   AST 34 30  ALT 19 10  ALKPHOS 87 70  BILITOT 0.9 0.7  PROT 5.4* 4.5*  ALBUMIN 3.0* 1.9*   Recent Labs  Lab 12/21/18 1203  LIPASE 514*   No results for input(s): AMMONIA in the last 168 hours. Coagulation profile No results for input(s): INR, PROTIME in the last 168 hours.  CBC: Recent Labs  Lab 12/21/18 1203 12/23/18 0714  WBC 14.3* 10.5  HGB 16.5* 13.9  HCT 48.9* 40.6  MCV 93.1 94.0  PLT 239 134*   Cardiac Enzymes: No results for input(s): CKTOTAL, CKMB, CKMBINDEX, TROPONINI in the last 168 hours. BNP: Invalid input(s): POCBNP CBG: No results for input(s): GLUCAP in the last 168 hours. D-Dimer No results for input(s): DDIMER in the last 72 hours. Hgb A1c No results for input(s): HGBA1C in the last 72 hours. Lipid Profile No results for input(s): CHOL, HDL, LDLCALC, TRIG, CHOLHDL, LDLDIRECT in the last 72 hours. Thyroid function studies No results for input(s): TSH, T4TOTAL, T3FREE, THYROIDAB in the last 72 hours.  Invalid input(s): FREET3 Anemia work up No results for input(s): VITAMINB12, FOLATE, FERRITIN, TIBC, IRON, RETICCTPCT in the last 72 hours. Microbiology Recent Results (from the past 240 hour(s))  SARS CORONAVIRUS 2 (TAT 6-24 HRS) Nasopharyngeal Nasopharyngeal Swab     Status: None   Collection Time: 12/21/18  4:21 PM   Specimen: Nasopharyngeal Swab  Result Value Ref Range Status   SARS Coronavirus 2 NEGATIVE NEGATIVE Final    Comment: (NOTE) SARS-CoV-2 target nucleic acids are NOT DETECTED. The SARS-CoV-2 RNA is generally detectable in upper and lower respiratory specimens during the acute phase of infection. Negative results do not preclude SARS-CoV-2 infection, do not rule out co-infections with other pathogens, and should not be used as the sole basis for treatment or other patient management decisions. Negative results must be combined with clinical observations, patient history, and epidemiological information. The expected result is  Negative. Fact Sheet for Patients: HairSlick.no Fact Sheet for Healthcare Providers: quierodirigir.com This test is not yet approved or cleared by the [place] FDA and  has been authorized for detection and/or diagnosis of SARS-CoV-2 by FDA under an Emergency Use Authorization (EUA). This EUA will remain  in effect (meaning this test can be used) for the duration of the COVID-19 declaration under Section 56 4(b)(1) of the Act, 21 U.S.C. section 360bbb-3(b)(1), unless the authorization is terminated or revoked sooner. Performed at [facility], [address], [place], [place] [address]      Discharge Instructions:   Discharge Instructions    Discharge instructions   Complete by: As directed    Soft diet Avoid alcohol   Increase activity slowly   Complete by: As directed      Allergies as of 12/26/2018      Reactions   Other Swelling   Cilantro  Lips and mouth   Lorazepam Anxiety, Other (See Comments)   Nightmares, paranoia      Medication List    TAKE these medications   amphetamine-dextroamphetamine 20 MG tablet Commonly known as: ADDERALL Take 20-30 mg by mouth See admin instructions. Take 30 mg in the morning, 30 mg at lunch and 20 mg in the evening as needed   guaiFENesin 100 MG/5ML Soln Commonly known as: ROBITUSSIN Take 5 mLs (100  mg total) by mouth every 4 (four) hours as needed for cough or to loosen phlegm.   lamoTRIgine 100 MG tablet Commonly known as: LAMICTAL Take 100 mg by mouth daily.   multivitamin with minerals Tabs tablet Take 1 tablet by mouth daily.   propranolol 20 MG tablet Commonly known as: INDERAL Take 20 mg by mouth 2 (two) times daily.   QUEtiapine 300 MG tablet Commonly known as: SEROQUEL Take 300 mg by mouth at bedtime.   sertraline 100 MG tablet Commonly known as: ZOLOFT Take 300 mg by mouth daily.   zonisamide 100 MG capsule Commonly known as: ZONEGRAN  Take 200 mg by mouth at bedtime.      Follow-up Information    follow up with PCP Follow up.            Time coordinating discharge: 35 min  Signed:  Geradine Girt DO  [facility] 12/26/2018, 9:55 AM

## 2018-12-26 NOTE — Progress Notes (Signed)
Kathryn Rubio to be discharged home per MD order. Discussed prescriptions and follow up appointments with the patient. Prescriptions given to patient; medication list explained in detail. Patient verbalized understanding.  Skin clean, dry and intact without evidence of skin break down, no evidence of skin tears noted. IV catheter discontinued intact. Site without signs and symptoms of complications. Dressing and pressure applied. Pt denies pain at the site currently. No complaints noted.  Patient free of lines, drains, and wounds.   An After Visit Summary (AVS) was printed and given to the patient. Patient escorted via wheelchair, and discharged home via private auto.  Baldo Ash, RN

## 2018-12-26 NOTE — Discharge Instructions (Signed)
Acute Pancreatitis    The pancreas is a gland that is located behind the stomach on the left side of the abdomen. It produces enzymes that help to digest food. The pancreas also releases the hormones glucagon and insulin, which help to regulate blood sugar. Acute pancreatitis happens when inflammation of the pancreas suddenly occurs and the pancreas becomes irritated and swollen. Most acute attacks last a few days and cause serious problems. Some people become dehydrated and develop low blood pressure. In severe cases, bleeding in the abdomen can lead to shock and can be life-threatening. The lungs, heart, and kidneys may fail. What are the causes? This condition may be caused by:  Alcohol abuse.  Drug abuse.  Gallstones or other conditions that can block the tube that drains the pancreas (pancreatic duct).  A tumor in the pancreas. Other causes include:  Certain medicines.  Exposure to certain chemicals.  Diabetes.  An infection in the pancreas.  Damage caused by an accident (trauma).  The poison (venom) from a scorpion bite.  Abdominal surgery.  Autoimmune pancreatitis. This is when the body's disease-fighting (immune) system attacks the pancreas.  Genes that are passed from parent to child (inherited). In some cases, the cause of this condition is not known. What are the signs or symptoms? Symptoms of this condition include:  Pain in the upper abdomen that may radiate to the back. Pain may be severe.  Tenderness and swelling of the abdomen.  Nausea and vomiting.  Fever. How is this diagnosed? This condition may be diagnosed based on:  A physical exam.  Blood tests.  Imaging tests, such as X-rays, CT or MRI scans, or an ultrasound of the abdomen. How is this treated? Treatment for this condition usually requires a stay in the hospital. Treatment for this condition may include:  Pain medicine.  Fluid replacement through an IV.  Placing a tube in the  stomach to remove stomach contents and to control vomiting (NG tube, or nasogastric tube).  Not eating for 3-4 days. This gives the pancreas a rest, because enzymes are not being produced that can cause further damage.  Antibiotic medicines, if your condition is caused by an infection.  Treating any underlying conditions that may be the cause.  Steroid medicines, if your condition is caused by your immune system attacking your body's own tissues (autoimmune disease).  Surgery on the pancreas or gallbladder. Follow these instructions at home: Eating and drinking   Follow instructions from your health care provider about diet. This may involve avoiding alcohol and decreasing the amount of fat in your diet.  Eat smaller, more frequent meals. This reduces the amount of digestive fluids that the pancreas produces.  Drink enough fluid to keep your urine pale yellow.  Do not drink alcohol if it caused your condition. General instructions  Take over-the-counter and prescription medicines only as told by your health care provider.  Do not drive or use heavy machinery while taking prescription pain medicine.  Ask your health care provider if the medicine prescribed to you can cause constipation. You may need to take steps to prevent or treat constipation, such as: ? Take an over-the-counter or prescription medicine for constipation. ? Eat foods that are high in fiber such as whole grains and beans. ? Limit foods that are high in fat and processed sugars, such as fried or sweet foods.  Do not use any products that contain nicotine or tobacco, such as cigarettes, e-cigarettes, and chewing tobacco. If you need help quitting,   ask your health care provider.  Get plenty of rest.  If directed, check your blood sugar at home as told by your health care provider.  Keep all follow-up visits as told by your health care provider. This is important. Contact a health care provider if you:  Do not  recover as quickly as expected.  Develop new or worsening symptoms.  Have persistent pain, weakness, or nausea.  Recover and then have another episode of pain.  Have a fever. Get help right away if:  You cannot eat or keep fluids down.  Your pain becomes severe.  Your skin or the white part of your eyes turns yellow (jaundice).  You have sudden swelling in your abdomen.  You vomit.  You feel dizzy or you faint.  Your blood sugar is high (over 300 mg/dL). Summary  Acute pancreatitis happens when inflammation of the pancreas suddenly occurs and the pancreas becomes irritated and swollen.  This condition is typically caused by alcohol abuse, drug abuse, or gallstones.  Treatment for this condition usually requires a stay in the hospital. This information is not intended to replace advice given to you by your health care provider. Make sure you discuss any questions you have with your health care provider. Document Released: 03/29/2005 Document Revised: 01/16/2018 Document Reviewed: 10/03/2017 Elsevier Patient Education  2020 [employer]  

## 2018-12-26 NOTE — Plan of Care (Signed)
°  Problem: Elimination: °Goal: Will not experience complications related to bowel motility °Outcome: Progressing °  °Problem: Pain Managment: °Goal: General experience of comfort will improve °Outcome: Progressing °  °

## 2018-12-26 NOTE — Plan of Care (Signed)
  Problem: Health Behavior/Discharge Planning: Goal: Ability to manage health-related needs will improve Outcome: Adequate for Discharge   Problem: Clinical Measurements: Goal: Ability to maintain clinical measurements within normal limits will improve Outcome: Adequate for Discharge Goal: Will remain free from infection Outcome: Adequate for Discharge Goal: Diagnostic test results will improve Outcome: Adequate for Discharge Goal: Respiratory complications will improve Outcome: Adequate for Discharge Goal: Cardiovascular complication will be avoided Outcome: Adequate for Discharge   Problem: Elimination: Goal: Will not experience complications related to bowel motility 12/26/2018 1042 by Baldo Ash, RN Outcome: Adequate for Discharge 12/26/2018 0830 by Baldo Ash, RN Outcome: Progressing Goal: Will not experience complications related to urinary retention Outcome: Adequate for Discharge   Problem: Pain Managment: Goal: General experience of comfort will improve 12/26/2018 1042 by Baldo Ash, RN Outcome: Adequate for Discharge 12/26/2018 0830 by Baldo Ash, RN Outcome: Progressing   Problem: Safety: Goal: Ability to remain free from injury will improve Outcome: Adequate for Discharge   Problem: Skin Integrity: Goal: Risk for impaired skin integrity will decrease Outcome: Adequate for Discharge   Problem: Education: Goal: Knowledge of Pancreatitis treatment and prevention will improve Outcome: Adequate for Discharge   Problem: Health Behavior/Discharge Planning: Goal: Ability to formulate a plan to maintain an alcohol-free life will improve Outcome: Adequate for Discharge   Problem: Nutritional: Goal: Ability to achieve adequate nutritional intake will improve Outcome: Adequate for Discharge   Problem: Clinical Measurements: Goal: Complications related to the disease process, condition or treatment will be avoided or minimized Outcome: Adequate  for Discharge

## 2019-03-12 ENCOUNTER — Emergency Department (HOSPITAL_COMMUNITY)
Admission: EM | Admit: 2019-03-12 | Discharge: 2019-03-12 | Disposition: A | Discharge status: Home / Self Care | Payer: BC Managed Care – PPO | Attending: Emergency Medicine | Admitting: Emergency Medicine

## 2019-03-12 ENCOUNTER — Other Ambulatory Visit: Payer: Self-pay

## 2019-03-12 ENCOUNTER — Encounter (HOSPITAL_COMMUNITY): Payer: Self-pay | Admitting: Emergency Medicine

## 2019-03-12 DIAGNOSIS — Y906 Blood alcohol level of 120-199 mg/100 ml: Secondary | ICD-10-CM | POA: Insufficient documentation

## 2019-03-12 DIAGNOSIS — Z20828 Contact with and (suspected) exposure to other viral communicable diseases: Secondary | ICD-10-CM | POA: Diagnosis not present

## 2019-03-12 DIAGNOSIS — F1029 Alcohol dependence with unspecified alcohol-induced disorder: Secondary | ICD-10-CM | POA: Diagnosis present

## 2019-03-12 DIAGNOSIS — Z79899 Other long term (current) drug therapy: Secondary | ICD-10-CM | POA: Diagnosis not present

## 2019-03-12 LAB — CBC WITH DIFFERENTIAL/PLATELET
Abs Immature Granulocytes: 0.02 10*3/uL (ref 0.00–0.07)
Basophils Absolute: 0 10*3/uL (ref 0.0–0.1)
Basophils Relative: 1 %
Eosinophils Absolute: 0.1 10*3/uL (ref 0.0–0.5)
Eosinophils Relative: 2 %
HCT: 47.7 % — ABNORMAL HIGH (ref 36.0–46.0)
Hemoglobin: 15.7 g/dL — ABNORMAL HIGH (ref 12.0–15.0)
Immature Granulocytes: 0 %
Lymphocytes Relative: 22 %
Lymphs Abs: 1.4 10*3/uL (ref 0.7–4.0)
MCH: 31.8 pg (ref 26.0–34.0)
MCHC: 32.9 g/dL (ref 30.0–36.0)
MCV: 96.8 fL (ref 80.0–100.0)
Monocytes Absolute: 0.7 10*3/uL (ref 0.1–1.0)
Monocytes Relative: 11 %
Neutro Abs: 4.1 10*3/uL (ref 1.7–7.7)
Neutrophils Relative %: 64 %
Platelets: 114 10*3/uL — ABNORMAL LOW (ref 150–400)
RBC: 4.93 MIL/uL (ref 3.87–5.11)
RDW: 16 % — ABNORMAL HIGH (ref 11.5–15.5)
WBC: 6.3 10*3/uL (ref 4.0–10.5)
nRBC: 0 % (ref 0.0–0.2)

## 2019-03-12 LAB — COMPREHENSIVE METABOLIC PANEL
ALT: 20 U/L (ref 0–44)
AST: 47 U/L — ABNORMAL HIGH (ref 15–41)
Albumin: 3.9 g/dL (ref 3.5–5.0)
Alkaline Phosphatase: 146 U/L — ABNORMAL HIGH (ref 38–126)
Anion gap: 17 — ABNORMAL HIGH (ref 5–15)
BUN: 5 mg/dL — ABNORMAL LOW (ref 6–20)
CO2: 17 mmol/L — ABNORMAL LOW (ref 22–32)
Calcium: 9.1 mg/dL (ref 8.9–10.3)
Chloride: 103 mmol/L (ref 98–111)
Creatinine, Ser: 0.52 mg/dL (ref 0.44–1.00)
GFR calc Af Amer: >60 mL/min (ref >60)
GFR calc non Af Amer: >60 mL/min (ref >60)
Glucose, Bld: 95 mg/dL (ref 70–99)
Potassium: 3.7 mmol/L (ref 3.5–5.1)
Sodium: 137 mmol/L (ref 135–145)
Total Bilirubin: 0.7 mg/dL (ref 0.3–1.2)
Total Protein: 6.8 g/dL (ref 6.5–8.1)

## 2019-03-12 LAB — URINALYSIS, ROUTINE W REFLEX MICROSCOPIC
Bacteria, UA: NONE SEEN
Bilirubin Urine: NEGATIVE
Glucose, UA: NEGATIVE mg/dL
Hgb urine dipstick: NEGATIVE
Ketones, ur: 5 mg/dL — AB
Nitrite: POSITIVE — AB
Protein, ur: NEGATIVE mg/dL
Specific Gravity, Urine: 1.009 (ref 1.005–1.030)
pH: 5 (ref 5.0–8.0)

## 2019-03-12 LAB — RAPID URINE DRUG SCREEN, HOSP PERFORMED
Amphetamines: NOT DETECTED
Barbiturates: NOT DETECTED
Benzodiazepines: NOT DETECTED
Cocaine: NOT DETECTED
Opiates: NOT DETECTED
Tetrahydrocannabinol: NOT DETECTED

## 2019-03-12 LAB — I-STAT BETA HCG BLOOD, ED (MC, WL, AP ONLY): I-stat hCG, quantitative: <5 m[IU]/mL (ref <5)

## 2019-03-12 LAB — ETHANOL: Alcohol, Ethyl (B): 181 mg/dL — ABNORMAL HIGH (ref <10)

## 2019-03-12 MED ORDER — SODIUM CHLORIDE 0.9 % IV SOLN: INTRAVENOUS | Status: DC | PRN

## 2019-03-12 MED ORDER — SODIUM CHLORIDE 0.9 % IV BOLUS
1000.0000 mL | Freq: Once | INTRAVENOUS | Status: AC
  Administered 2019-03-12: 1000 mL via INTRAVENOUS

## 2019-03-12 MED ORDER — LORAZEPAM 2 MG/ML IJ SOLN
1.0000 mg | Freq: Once | INTRAMUSCULAR | Status: AC
  Administered 2019-03-12: 1 mg via INTRAVENOUS
  Filled 2019-03-12: qty 1

## 2019-03-12 MED ORDER — SODIUM CHLORIDE 0.9 % IV BOLUS
1000.0000 mL | Freq: Once | INTRAVENOUS | Status: AC
  Administered 2019-03-12: 14:00:00 1000 mL via INTRAVENOUS

## 2019-03-12 NOTE — ED Triage Notes (Addendum)
Per EMS pt hx of alcoholism; had glass of wine just prior to EMS arrival. No eating or drinking for 4 days.  Denies SI/HI with triage.

## 2019-03-12 NOTE — Discharge Instructions (Addendum)
Follow-up with outpatient treatment facilities as so desired.  The contact information for local treatment centers has been provided in this discharge summary for you to call and make these arrangements.

## 2019-03-12 NOTE — ED Notes (Signed)
Pure wick has been placed. Suction set to 75mmHg. Pt has been informed on proper use.

## 2019-03-12 NOTE — ED Provider Notes (Signed)
[facility] DEPT Provider Note   CSN: [id number] Arrival date & time: 03/12/19  0849     History   Chief Complaint Chief Complaint  Patient presents with  . Eating Disorder    HPI Kathryn Rubio is a 33 y.o. female.     Patient is a 33 year old female with history of seizures, gastric bypass, and depression.  She presents today stating that "I do not want to die".  Patient tells me that she has been consuming excessive quantities of alcohol and believes she is killing herself by doing so.  She drinks wine daily, most recently this morning.  She is requesting help with her alcoholism.  She denies drug use.  She denies other specific complaints.  The history is provided by the patient.    Past Medical History:  Diagnosis Date  . H/O gastric bypass   . Major depressive disorder   . Seizures ([place])    epilepsy    Patient Active Problem List   Diagnosis Date Noted  . Acute pancreatitis 12/22/2018  . Pancreatitis 12/21/2018    Past Surgical History:  Procedure Laterality Date  . GASTRIC BYPASS       OB History   No obstetric history on file.      Home Medications    Prior to Admission medications   Medication Sig Start Date End Date Taking? Authorizing Provider  amphetamine-dextroamphetamine (ADDERALL) 20 MG tablet Take 20-30 mg by mouth See admin instructions. Take 30 mg in the morning, 30 mg at lunch and 20 mg in the evening as needed    [provider]  guaiFENesin (ROBITUSSIN) 100 MG/5ML SOLN Take 5 mLs (100 mg total) by mouth every 4 (four) hours as needed for cough or to loosen phlegm. 12/26/18   Geradine Girt, DO  lamoTRIgine (LAMICTAL) 100 MG tablet Take 100 mg by mouth daily.    [provider]  Multiple Vitamin (MULTIVITAMIN WITH MINERALS) TABS tablet Take 1 tablet by mouth daily.    [provider]  propranolol (INDERAL) 20 MG tablet Take 20 mg by mouth 2 (two) times daily.    [provider]  QUEtiapine (SEROQUEL) 300 MG tablet Take 300 mg by mouth at bedtime.    [provider]  sertraline (ZOLOFT) 100 MG tablet Take 300 mg by mouth daily.    [provider]  zonisamide (ZONEGRAN) 100 MG capsule Take 200 mg by mouth at bedtime.    [provider]    Family History No family history on file.  Social History Social History   Tobacco Use  . Smoking status: Never Smoker  . Smokeless tobacco: Never Used  Substance Use Topics  . Alcohol use: Not Currently  . Drug use: Not on file     Allergies   Other and Lorazepam   Review of Systems Review of Systems  All other systems reviewed and are negative.    Physical Exam Updated Vital Signs BP (!) 130/98   Pulse (!) 113   Temp 98.8 F (37.1 C) (Oral)   Resp 16   SpO2 97%   Physical Exam Vitals signs and nursing note reviewed.  Constitutional:      General: She is not in acute distress.    Appearance: She is well-developed. She is not diaphoretic.  HENT:     Head: Normocephalic and atraumatic.  Eyes:     Extraocular Movements: Extraocular movements intact.     Pupils: Pupils are equal, round, and reactive  to light.  Neck:     Musculoskeletal: Normal range of motion and neck supple.  Cardiovascular:     Rate and Rhythm: Normal rate and regular rhythm.     Heart sounds: No murmur. No friction rub. No gallop.   Pulmonary:     Effort: Pulmonary effort is normal. No respiratory distress.     Breath sounds: Normal breath sounds. No wheezing.  Abdominal:     General: Bowel sounds are normal. There is no distension.     Palpations: Abdomen is soft.     Tenderness: There is no abdominal tenderness.  Musculoskeletal: Normal range of motion.  Skin:    General: Skin is warm and dry.  Neurological:     Mental Status: She is alert and oriented to person, place, and time.      ED Treatments / Results  Labs (all labs ordered are listed, but only abnormal results are  displayed) Labs Reviewed  COMPREHENSIVE METABOLIC PANEL  ETHANOL  CBC WITH DIFFERENTIAL/PLATELET  URINALYSIS, ROUTINE W REFLEX MICROSCOPIC  RAPID URINE DRUG SCREEN, HOSP PERFORMED  I-STAT BETA HCG BLOOD, ED ([facility], [facility], [facility] ONLY)    EKG None  Radiology No results found.  Procedures Procedures (including critical care time)  Medications Ordered in ED Medications  sodium chloride 0.9 % bolus 1,000 mL (has no administration in time range)     Initial Impression / Assessment and Plan / ED Course  I have reviewed the triage vital signs and the nursing notes.  Pertinent labs & imaging results that were available during my care of the patient were reviewed by me and considered in my medical decision making (see chart for details).  Patient is a 33 year old female with a history of chronic alcoholism.  She presents today with complaints of excessive alcohol consumption.  Patient states she had wine prior to coming here this morning.  Patient states that she wants to go into treatment and "does not want to die".  Patient's vitals are stable and medical work-up is unremarkable.  Patient hydrated with normal saline and observed in the ER for several hours.  I feel as though she is medically cleared for outpatient treatment.  She will be given follow-up information for outpatient treatment centers.  Final Clinical Impressions(s) / ED Diagnoses   Final diagnoses:  None    ED Discharge Orders    None       Geoffery Lyons, MD 03/12/19 1249

## 2019-10-04 ENCOUNTER — Emergency Department
Admission: EM | Admit: 2019-10-04 | Discharge: 2019-10-04 | Disposition: A | Discharge status: Home / Self Care | Payer: No Typology Code available for payment source

## 2019-10-04 ENCOUNTER — Other Ambulatory Visit: Payer: Self-pay

## 2019-10-04 NOTE — ED Triage Notes (Addendum)
Pt requesting detox. Relapsed on alcohol today feeling slightly nauseated. Pt steady on feet and ambulatory to ems stretcher.

## 2021-02-16 IMAGING — DX DG CHEST 1V PORT
1 series · 1 of 1 positions shown · non-contrast
Comparison: None.

CLINICAL DATA: Cough

EXAM:
PORTABLE CHEST 1 VIEW

[chest ap]
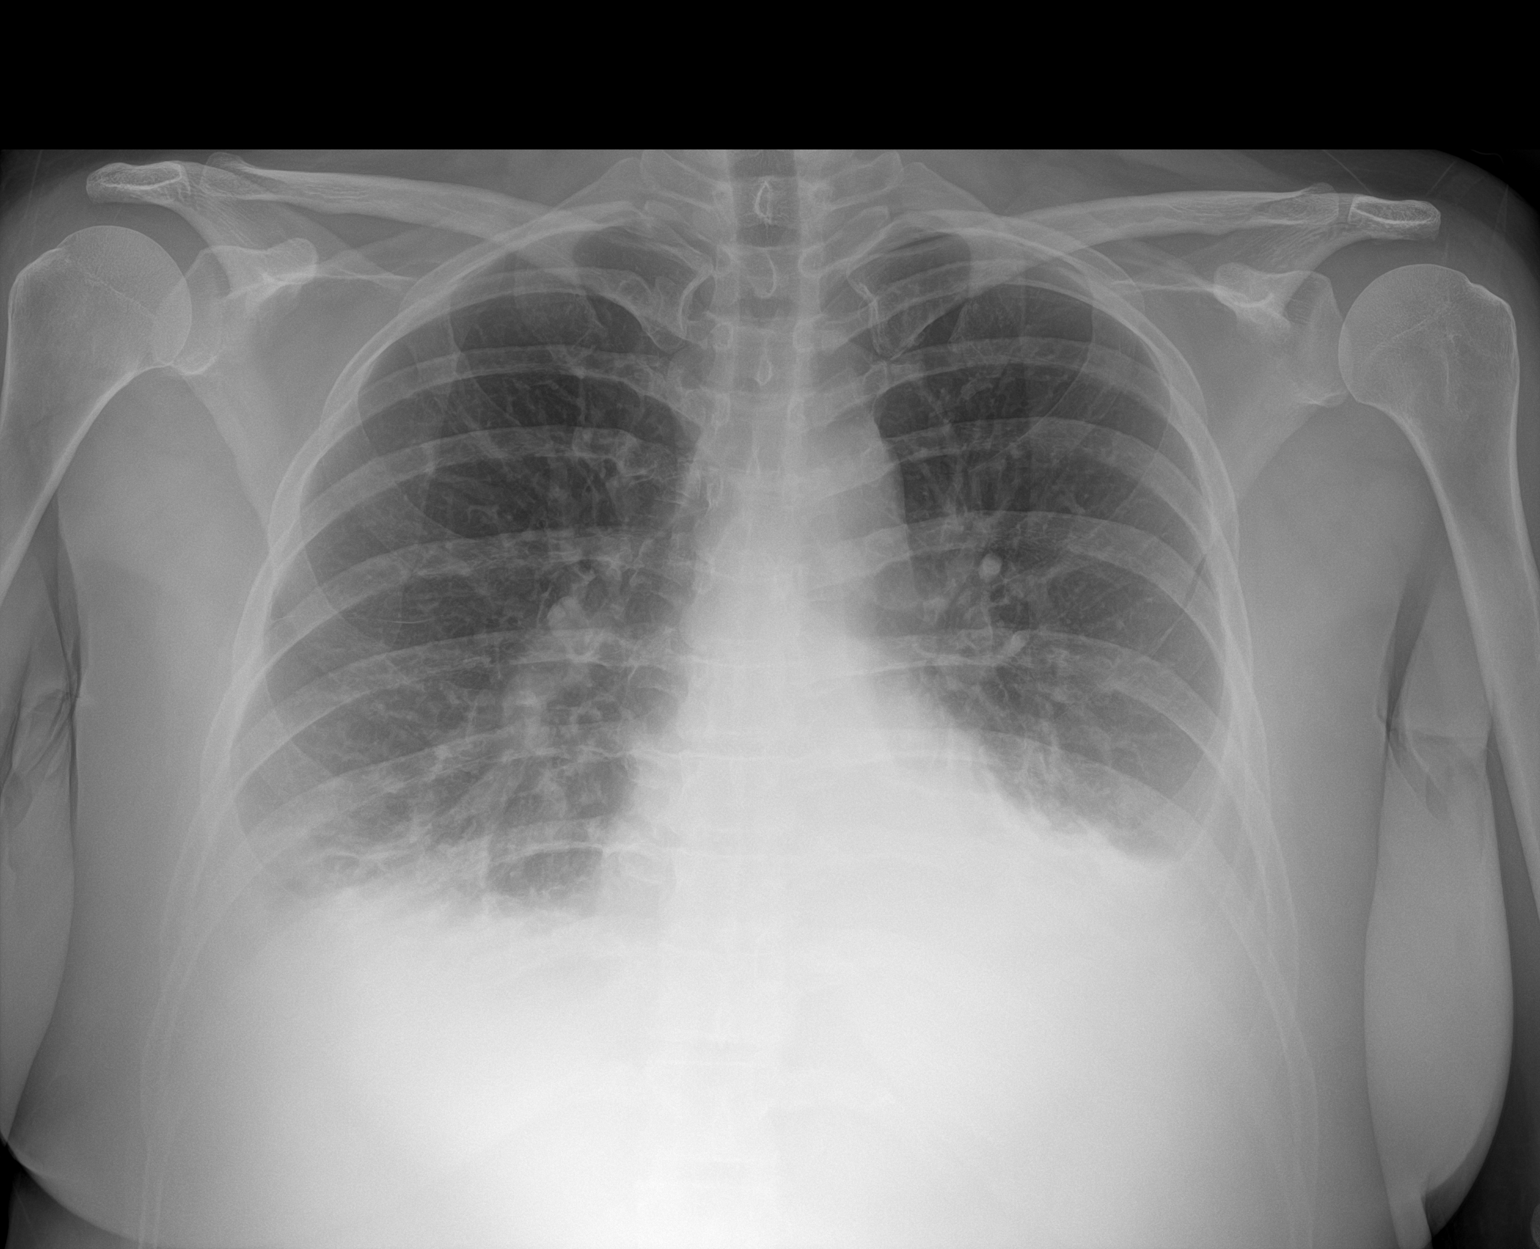

[1 of 1 positions shown; findings below may reference images not displayed]

FINDINGS: The heart size and mediastinal contours are within normal limits.
Small bilateral pleural effusions, left greater than right, and
associated atelectasis or consolidation. The visualized skeletal
structures are unremarkable.
IMPRESSION: Small bilateral pleural effusions, left greater than right, and
associated atelectasis or consolidation.

## 2022-07-05 ENCOUNTER — Ambulatory Visit: Payer: Medicaid Other | Admitting: Physician Assistant

## 2022-07-05 ENCOUNTER — Encounter: Payer: Self-pay | Admitting: Physician Assistant

## 2022-07-05 VITALS — BP 115/83 | HR 59 | Ht 65.0 in | Wt 198.0 lb

## 2022-07-05 DIAGNOSIS — F1010 Alcohol abuse, uncomplicated: Secondary | ICD-10-CM

## 2022-07-05 DIAGNOSIS — D72819 Decreased white blood cell count, unspecified: Secondary | ICD-10-CM

## 2022-07-05 DIAGNOSIS — Z9884 Bariatric surgery status: Secondary | ICD-10-CM

## 2022-07-05 DIAGNOSIS — F909 Attention-deficit hyperactivity disorder, unspecified type: Secondary | ICD-10-CM

## 2022-07-05 DIAGNOSIS — D649 Anemia, unspecified: Secondary | ICD-10-CM | POA: Diagnosis not present

## 2022-07-05 DIAGNOSIS — F3341 Major depressive disorder, recurrent, in partial remission: Secondary | ICD-10-CM

## 2022-07-05 DIAGNOSIS — G40909 Epilepsy, unspecified, not intractable, without status epilepticus: Secondary | ICD-10-CM

## 2022-07-05 DIAGNOSIS — F411 Generalized anxiety disorder: Secondary | ICD-10-CM | POA: Diagnosis not present

## 2022-07-05 DIAGNOSIS — D696 Thrombocytopenia, unspecified: Secondary | ICD-10-CM

## 2022-07-05 MED ORDER — CARBAMAZEPINE ER 200 MG PO CP12: 200.0000 mg | ORAL_CAPSULE | Freq: Two times a day (BID) | ORAL | 1 refills | Status: AC

## 2022-07-05 MED ORDER — FLUOXETINE HCL 20 MG PO CAPS: 60.0000 mg | ORAL_CAPSULE | Freq: Every day | ORAL | 1 refills | Status: AC

## 2022-07-05 MED ORDER — PROPRANOLOL HCL 40 MG PO TABS: 40.0000 mg | ORAL_TABLET | Freq: Two times a day (BID) | ORAL | 1 refills | Status: AC

## 2022-07-05 MED ORDER — QUETIAPINE FUMARATE 400 MG PO TABS: 400.0000 mg | ORAL_TABLET | Freq: Every day | ORAL | 1 refills | Status: AC

## 2022-07-05 MED ORDER — HYDROXYZINE PAMOATE 50 MG PO CAPS: 50.0000 mg | ORAL_CAPSULE | Freq: Three times a day (TID) | ORAL | 1 refills | Status: AC | PRN

## 2022-07-05 MED ORDER — BUSPIRONE HCL 10 MG PO TABS: 10.0000 mg | ORAL_TABLET | Freq: Three times a day (TID) | ORAL | 1 refills | Status: AC

## 2022-07-05 MED ORDER — TRAZODONE HCL 50 MG PO TABS: 50.0000 mg | ORAL_TABLET | Freq: Every day | ORAL | 1 refills | Status: AC

## 2022-07-05 NOTE — Progress Notes (Signed)
New Patient Office Visit  Subjective    Patient ID: Kathryn Rubio, female    DOB: [birth year]  Age: 37 y.o. MRN: [id number]  CC:  Chief Complaint  Patient presents with  . Medication Refill    No concerns.     HPI Kathryn Rubio 06/10/22 - not set yet   Upped due to depression  Sleep is good  Tegretol -   Vivitrol   - once monthly injection - 05/21/22      Outpatient Encounter Medications as of 07/05/2022  Medication Sig  . carbamazepine (CARBATROL) 200 MG 12 hr capsule Take 200 mg by mouth 2 (two) times daily.  Marland Kitchen FLUoxetine (PROZAC) 20 MG capsule Take 60 mg by mouth daily.  . hydrOXYzine (VISTARIL) 50 MG capsule Take 50 mg by mouth 3 (three) times daily as needed.  . propranolol (INDERAL) 40 MG tablet Take 40 mg by mouth 2 (two) times daily.  . QUEtiapine (SEROQUEL) 400 MG tablet Take 400 mg by mouth at bedtime.  . [DISCONTINUED] amphetamine-dextroamphetamine (ADDERALL) 20 MG tablet Take 20-30 mg by mouth See admin instructions. Take 30 mg in the morning, 30 mg at lunch and 20 mg in the evening as needed for focus  . [DISCONTINUED] guaiFENesin (ROBITUSSIN) 100 MG/5ML SOLN Take 5 mLs (100 mg total) by mouth every 4 (four) hours as needed for cough or to loosen phlegm. (Patient not taking: Reported on 03/12/2019)  . [DISCONTINUED] lamoTRIgine (LAMICTAL) 100 MG tablet Take 100 mg by mouth daily.  . [DISCONTINUED] Multiple Vitamin (MULTIVITAMIN WITH MINERALS) TABS tablet Take 1 tablet by mouth daily.  . [DISCONTINUED] propranolol (INDERAL) 20 MG tablet Take 20 mg by mouth 2 (two) times daily.  . [DISCONTINUED] QUEtiapine (SEROQUEL) 300 MG tablet Take 300 mg by mouth at bedtime.  . [DISCONTINUED] sertraline (ZOLOFT) 100 MG tablet Take 300 mg by mouth daily.  . [DISCONTINUED] zonisamide (ZONEGRAN) 100 MG capsule Take 200 mg by mouth at bedtime.   No facility-administered encounter medications on file as of 07/05/2022.    Past Medical History:  Diagnosis Date  . H/O gastric  bypass   . Major depressive disorder   . Seizures ([place])    epilepsy    Past Surgical History:  Procedure Laterality Date  . GASTRIC BYPASS      No family history on file.  Social History   Socioeconomic History  . Marital status: Single    Spouse name: Not on file  . Number of children: Not on file  . Years of education: Not on file  . Highest education level: Not on file  Occupational History  . Not on file  Tobacco Use  . Smoking status: Never  . Smokeless tobacco: Never  Substance and Sexual Activity  . Alcohol use: Not Currently  . Drug use: Not on file  . Sexual activity: Not on file  Other Topics Concern  . Not on file  Social History Narrative  . Not on file   Social Determinants of Health   Financial Resource Strain: Not on file  Food Insecurity: Not on file  Transportation Needs: Not on file  Physical Activity: Not on file  Stress: Not on file  Social Connections: Not on file  Intimate Partner Violence: Not on file    ROS      Objective    There were no vitals taken for this visit.  Physical Exam  {Labs (Optional):23779}    Assessment & Plan:   Problem List Items Addressed This Visit  None Visit Diagnoses     Recurrent major depressive disorder, in partial remission (HCC)    -  Primary   Relevant Medications   FLUoxetine (PROZAC) 20 MG capsule   hydrOXYzine (VISTARIL) 50 MG capsule   GAD (generalized anxiety disorder)       Relevant Medications   FLUoxetine (PROZAC) 20 MG capsule   hydrOXYzine (VISTARIL) 50 MG capsule   Attention deficit hyperactivity disorder (ADHD), unspecified ADHD type       Alcohol abuse       Seizure disorder (HCC)       Relevant Medications   carbamazepine (CARBATROL) 200 MG 12 hr capsule   History of gastric bypass       Thrombocytopenia (HCC)       Anemia, unspecified type       Leukopenia, unspecified type           No follow-ups on file.   Loraine Grip Mayers, PA-C

## 2022-07-05 NOTE — Patient Instructions (Signed)
We will call you with today's lab results.  Kennieth Rad, PA-C Physician Assistant [facility] [email or web address]   Health Maintenance, Female Adopting a healthy lifestyle and getting preventive care are important in promoting health and wellness. Ask your health care provider about: The right schedule for you to have regular tests and exams. Things you can do on your own to prevent diseases and keep yourself healthy. What should I know about diet, weight, and exercise? Eat a healthy diet  Eat a diet that includes plenty of vegetables, fruits, low-fat dairy products, and lean protein. Do not eat a lot of foods that are high in solid fats, added sugars, or sodium. Maintain a healthy weight Body mass index (BMI) is used to identify weight problems. It estimates body fat based on height and weight. Your health care provider can help determine your BMI and help you achieve or maintain a healthy weight. Get regular exercise Get regular exercise. This is one of the most important things you can do for your health. Most adults should: Exercise for at least 150 minutes each week. The exercise should increase your heart rate and make you sweat (moderate-intensity exercise). Do strengthening exercises at least twice a week. This is in addition to the moderate-intensity exercise. Spend less time sitting. Even light physical activity can be beneficial. Watch cholesterol and blood lipids Have your blood tested for lipids and cholesterol at 37 years of age, then have this test every 5 years. Have your cholesterol levels checked more often if: Your lipid or cholesterol levels are high. You are older than 37 years of age. You are at high risk for heart disease. What should I know about cancer screening? Depending on your health history and family history, you may need to have cancer screening at various ages. This may include screening  for: Breast cancer. Cervical cancer. Colorectal cancer. Skin cancer. Lung cancer. What should I know about heart disease, diabetes, and high blood pressure? Blood pressure and heart disease High blood pressure causes heart disease and increases the risk of stroke. This is more likely to develop in people who have high blood pressure readings or are overweight. Have your blood pressure checked: Every 3-5 years if you are 71-77 years of age. Every year if you are 29 years old or older. Diabetes Have regular diabetes screenings. This checks your fasting blood sugar level. Have the screening done: Once every three years after age 29 if you are at a normal weight and have a low risk for diabetes. More often and at a younger age if you are overweight or have a high risk for diabetes. What should I know about preventing infection? Hepatitis B If you have a higher risk for hepatitis B, you should be screened for this virus. Talk with your health care provider to find out if you are at risk for hepatitis B infection. Hepatitis C Testing is recommended for: Everyone born from [birth year] through 1965. Anyone with known risk factors for hepatitis C. Sexually transmitted infections (STIs) Get screened for STIs, including gonorrhea and chlamydia, if: You are sexually active and are younger than 37 years of age. You are older than 37 years of age and your health care provider tells you that you are at risk for this type of infection. Your sexual activity has changed since you were last screened, and you are at increased risk for chlamydia or gonorrhea. Ask your health care provider if you are at risk. Ask your health care provider  about whether you are at high risk for HIV. Your health care provider may recommend a prescription medicine to help prevent HIV infection. If you choose to take medicine to prevent HIV, you should first get tested for HIV. You should then be tested every 3 months for as long as you  are taking the medicine. Pregnancy If you are about to stop having your period (premenopausal) and you may become pregnant, seek counseling before you get pregnant. Take 400 to 800 micrograms (mcg) of folic acid every day if you become pregnant. Ask for birth control (contraception) if you want to prevent pregnancy. Osteoporosis and menopause Osteoporosis is a disease in which the bones lose minerals and strength with aging. This can result in bone fractures. If you are 5 years old or older, or if you are at risk for osteoporosis and fractures, ask your health care provider if you should: Be screened for bone loss. Take a calcium or vitamin D supplement to lower your risk of fractures. Be given hormone replacement therapy (HRT) to treat symptoms of menopause. Follow these instructions at home: Alcohol use Do not drink alcohol if: Your health care provider tells you not to drink. You are pregnant, may be pregnant, or are planning to become pregnant. If you drink alcohol: Limit how much you have to: 0-1 drink a day. Know how much alcohol is in your drink. In the [place] one drink equals one 12 oz bottle of beer (355 mL), one 5 oz glass of wine (148 mL), or one 1 oz glass of hard liquor (44 mL). Lifestyle Do not use any products that contain nicotine or tobacco. These products include cigarettes, chewing tobacco, and vaping devices, such as e-cigarettes. If you need help quitting, ask your health care provider. Do not use street drugs. Do not share needles. Ask your health care provider for help if you need support or information about quitting drugs. General instructions Schedule regular health, dental, and eye exams. Stay current with your vaccines. Tell your health care provider if: You often feel depressed. You have ever been abused or do not feel safe at home. Summary Adopting a healthy lifestyle and getting preventive care are important in promoting health and wellness. Follow your  health care provider's instructions about healthy diet, exercising, and getting tested or screened for diseases. Follow your health care provider's instructions on monitoring your cholesterol and blood pressure. This information is not intended to replace advice given to you by your health care provider. Make sure you discuss any questions you have with your health care provider. Document Revised: 08/18/2020 Document Reviewed: 08/18/2020 Elsevier Patient Education  [place].

## 2022-07-06 ENCOUNTER — Encounter: Payer: Self-pay | Admitting: Physician Assistant

## 2022-07-06 DIAGNOSIS — F3341 Major depressive disorder, recurrent, in partial remission: Secondary | ICD-10-CM | POA: Insufficient documentation

## 2022-07-06 DIAGNOSIS — Z9884 Bariatric surgery status: Secondary | ICD-10-CM | POA: Insufficient documentation

## 2022-07-06 DIAGNOSIS — F909 Attention-deficit hyperactivity disorder, unspecified type: Secondary | ICD-10-CM | POA: Insufficient documentation

## 2022-07-06 DIAGNOSIS — G40909 Epilepsy, unspecified, not intractable, without status epilepticus: Secondary | ICD-10-CM | POA: Insufficient documentation

## 2022-07-06 DIAGNOSIS — F1010 Alcohol abuse, uncomplicated: Secondary | ICD-10-CM | POA: Insufficient documentation

## 2022-07-06 DIAGNOSIS — F411 Generalized anxiety disorder: Secondary | ICD-10-CM | POA: Insufficient documentation

## 2022-07-06 LAB — COMP. METABOLIC PANEL (12)
AST: 17 IU/L (ref 0–40)
Albumin/Globulin Ratio: 2 (ref 1.2–2.2)
Albumin: 4.3 g/dL (ref 3.9–4.9)
Alkaline Phosphatase: 61 IU/L (ref 44–121)
BUN/Creatinine Ratio: 18 (ref 9–23)
BUN: 15 mg/dL (ref 6–20)
Bilirubin Total: <0.2 mg/dL (ref 0.0–1.2)
Calcium: 9 mg/dL (ref 8.7–10.2)
Chloride: 104 mmol/L (ref 96–106)
Creatinine, Ser: 0.83 mg/dL (ref 0.57–1.00)
Globulin, Total: 2.2 g/dL (ref 1.5–4.5)
Glucose: 94 mg/dL (ref 70–99)
Potassium: 4.6 mmol/L (ref 3.5–5.2)
Sodium: 138 mmol/L (ref 134–144)
Total Protein: 6.5 g/dL (ref 6.0–8.5)
eGFR: 93 mL/min/{1.73_m2} (ref >59)

## 2022-07-06 LAB — CBC WITH DIFFERENTIAL/PLATELET
Basophils Absolute: 0 10*3/uL (ref 0.0–0.2)
Basos: 1 %
EOS (ABSOLUTE): 0.2 10*3/uL (ref 0.0–0.4)
Eos: 4 %
Hematocrit: 37.1 % (ref 34.0–46.6)
Hemoglobin: 12.4 g/dL (ref 11.1–15.9)
Immature Grans (Abs): 0 10*3/uL (ref 0.0–0.1)
Immature Granulocytes: 0 %
Lymphocytes Absolute: 1.7 10*3/uL (ref 0.7–3.1)
Lymphs: 37 %
MCH: 29.5 pg (ref 26.6–33.0)
MCHC: 33.4 g/dL (ref 31.5–35.7)
MCV: 88 fL (ref 79–97)
Monocytes Absolute: 0.5 10*3/uL (ref 0.1–0.9)
Monocytes: 10 %
Neutrophils Absolute: 2.3 10*3/uL (ref 1.4–7.0)
Neutrophils: 48 %
Platelets: 231 10*3/uL (ref 150–450)
RBC: 4.21 x10E6/uL (ref 3.77–5.28)
RDW: 12.9 % (ref 11.7–15.4)
WBC: 4.7 10*3/uL (ref 3.4–10.8)

## 2022-07-06 LAB — IRON,TIBC AND FERRITIN PANEL
Ferritin: 16 ng/mL (ref 15–150)
Iron Saturation: 18 % (ref 15–55)
Iron: 56 ug/dL (ref 27–159)
Total Iron Binding Capacity: 305 ug/dL (ref 250–450)
UIBC: 249 ug/dL (ref 131–425)
